# Patient Record
Sex: Male | Born: 1985 | Race: Black or African American | Hispanic: No | Marital: Married | Smoking: Current some day smoker
Health system: Southern US, Community
[De-identification: ages and names within clinical notes are randomized; demographics above are authoritative.]

## PROBLEM LIST (undated history)

## (undated) HISTORY — PX: NO PAST SURGERIES: SHX2092

---
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
## 2009-05-29 ENCOUNTER — Emergency Department: Payer: Self-pay | Admitting: Emergency Medicine

## 2010-05-09 ENCOUNTER — Emergency Department: Payer: Self-pay | Admitting: Emergency Medicine

## 2015-05-23 ENCOUNTER — Ambulatory Visit
Admission: EM | Admit: 2015-05-23 | Discharge: 2015-05-23 | Disposition: A | Discharge status: Home / Self Care | Payer: BLUE CROSS/BLUE SHIELD | Attending: Family Medicine | Admitting: Family Medicine

## 2015-05-23 ENCOUNTER — Encounter: Payer: Self-pay | Admitting: *Deleted

## 2015-05-23 DIAGNOSIS — K529 Noninfective gastroenteritis and colitis, unspecified: Secondary | ICD-10-CM | POA: Insufficient documentation

## 2015-05-23 DIAGNOSIS — Z791 Long term (current) use of non-steroidal anti-inflammatories (NSAID): Secondary | ICD-10-CM | POA: Diagnosis not present

## 2015-05-23 DIAGNOSIS — R300 Dysuria: Secondary | ICD-10-CM

## 2015-05-23 DIAGNOSIS — R1031 Right lower quadrant pain: Secondary | ICD-10-CM | POA: Diagnosis present

## 2015-05-23 DIAGNOSIS — R109 Unspecified abdominal pain: Secondary | ICD-10-CM | POA: Diagnosis not present

## 2015-05-23 DIAGNOSIS — F17200 Nicotine dependence, unspecified, uncomplicated: Secondary | ICD-10-CM | POA: Insufficient documentation

## 2015-05-23 DIAGNOSIS — N410 Acute prostatitis: Secondary | ICD-10-CM | POA: Insufficient documentation

## 2015-05-23 LAB — CBC WITH DIFFERENTIAL/PLATELET
BASOS ABS: 0 10*3/uL (ref 0–0.1)
BASOS ABS: 0 10*3/uL (ref 0–0.1)
BASOS PCT: 0 %
Basophils Relative: 0 %
EOS ABS: 0 10*3/uL (ref 0–0.7)
EOS PCT: 0 %
EOS PCT: 0 %
Eosinophils Absolute: 0 10*3/uL (ref 0–0.7)
HEMATOCRIT: 43.2 % (ref 40.0–52.0)
HEMATOCRIT: 44.3 % (ref 40.0–52.0)
HEMOGLOBIN: 15.1 g/dL (ref 13.0–18.0)
Hemoglobin: 14.7 g/dL (ref 13.0–18.0)
LYMPHS ABS: 0.9 10*3/uL — AB (ref 1.0–3.6)
LYMPHS ABS: 1.2 10*3/uL (ref 1.0–3.6)
LYMPHS PCT: 14 %
LYMPHS PCT: 18 %
MCH: 28.3 pg (ref 26.0–34.0)
MCH: 28.5 pg (ref 26.0–34.0)
MCHC: 34 g/dL (ref 32.0–36.0)
MCHC: 34.1 g/dL (ref 32.0–36.0)
MCV: 83.2 fL (ref 80.0–100.0)
MCV: 83.5 fL (ref 80.0–100.0)
MONO ABS: 0.7 10*3/uL (ref 0.2–1.0)
MONO ABS: 0.6 10*3/uL (ref 0.2–1.0)
MONOS PCT: 11 %
Monocytes Relative: 9 %
NEUTROS ABS: 4.6 10*3/uL (ref 1.4–6.5)
Neutro Abs: 4.8 10*3/uL (ref 1.4–6.5)
Neutrophils Relative %: 75 %
Neutrophils Relative %: 73 %
Platelets: 190 10*3/uL (ref 150–440)
Platelets: 197 10*3/uL (ref 150–440)
RBC: 5.19 MIL/uL (ref 4.40–5.90)
RBC: 5.31 MIL/uL (ref 4.40–5.90)
RDW: 13.3 % (ref 11.5–14.5)
RDW: 13.4 % (ref 11.5–14.5)
WBC: 6.1 10*3/uL (ref 3.8–10.6)
WBC: 6.6 10*3/uL (ref 3.8–10.6)

## 2015-05-23 LAB — COMPREHENSIVE METABOLIC PANEL
ALBUMIN: 4.5 g/dL (ref 3.5–5.0)
ALK PHOS: 78 U/L (ref 38–126)
ALT: 34 U/L (ref 17–63)
ALT: 35 U/L (ref 17–63)
ANION GAP: 6 (ref 5–15)
AST: 25 U/L (ref 15–41)
AST: 28 U/L (ref 15–41)
Albumin: 4.2 g/dL (ref 3.5–5.0)
Alkaline Phosphatase: 81 U/L (ref 38–126)
Anion gap: 9 (ref 5–15)
BILIRUBIN TOTAL: 0.8 mg/dL (ref 0.3–1.2)
BILIRUBIN TOTAL: 0.9 mg/dL (ref 0.3–1.2)
BUN: 10 mg/dL (ref 6–20)
BUN: 10 mg/dL (ref 6–20)
CALCIUM: 9.4 mg/dL (ref 8.9–10.3)
CO2: 30 mmol/L (ref 22–32)
CO2: 30 mmol/L (ref 22–32)
CREATININE: 1.08 mg/dL (ref 0.61–1.24)
Calcium: 9.5 mg/dL (ref 8.9–10.3)
Chloride: 96 mmol/L — ABNORMAL LOW (ref 101–111)
Chloride: 99 mmol/L — ABNORMAL LOW (ref 101–111)
Creatinine, Ser: 1.08 mg/dL (ref 0.61–1.24)
GFR calc non Af Amer: >60 mL/min (ref >60)
Glucose, Bld: 96 mg/dL (ref 65–99)
Glucose, Bld: 94 mg/dL (ref 65–99)
POTASSIUM: 3.7 mmol/L (ref 3.5–5.1)
POTASSIUM: 3.5 mmol/L (ref 3.5–5.1)
SODIUM: 135 mmol/L (ref 135–145)
Sodium: 135 mmol/L (ref 135–145)
TOTAL PROTEIN: 7.8 g/dL (ref 6.5–8.1)
Total Protein: 8.2 g/dL — ABNORMAL HIGH (ref 6.5–8.1)

## 2015-05-23 LAB — URINALYSIS COMPLETE WITH MICROSCOPIC (ARMC ONLY)
Bacteria, UA: NONE SEEN — AB
GLUCOSE, UA: NEGATIVE mg/dL
KETONES UR: NEGATIVE mg/dL
LEUKOCYTES UA: NEGATIVE
NITRITE: NEGATIVE
PROTEIN: NEGATIVE mg/dL
SPECIFIC GRAVITY, URINE: 1.02 (ref 1.005–1.030)
pH: 7 (ref 5.0–8.0)

## 2015-05-23 LAB — CHLAMYDIA/NGC RT PCR (ARMC ONLY)
Chlamydia Tr: NOT DETECTED
N GONORRHOEAE: NOT DETECTED

## 2015-05-23 LAB — LIPASE, BLOOD: LIPASE: 30 U/L (ref 22–51)

## 2015-05-23 MED ORDER — KETOROLAC TROMETHAMINE 60 MG/2ML IM SOLN
60.0000 mg | Freq: Once | INTRAMUSCULAR | Status: AC
  Administered 2015-05-23: 60 mg via INTRAMUSCULAR

## 2015-05-23 MED ORDER — ACETAMINOPHEN 500 MG PO TABS: 1000.0000 mg | ORAL_TABLET | Freq: Once | ORAL | Status: DC

## 2015-05-23 NOTE — ED Notes (Signed)
Pt sent for [facility] due to abdominal pain and need for CT Scan. Pain in mid abdomen. Had blood/urine at urgent care. Febrile, no WBC.

## 2015-05-23 NOTE — ED Notes (Signed)
Spoke to MD Sharma CovertNorman about ordering CT Scan, states to wait until physician evaluation.

## 2015-05-23 NOTE — Discharge Instructions (Signed)
As discussed, go to ER for further evaluation. Go directly there. Do not eat or drink.   Return to Urgent care for new or worsening concerns.   Abdominal Pain, Adult Many things can cause abdominal pain. Usually, abdominal pain is not caused by a disease and will improve without treatment. It can often be observed and treated at home. Your health care provider will do a physical exam and possibly order blood tests and X-rays to help determine the seriousness of your pain. However, in many cases, more time must pass before a clear cause of the pain can be found. Before that point, your health care provider may not know if you need more testing or further treatment. HOME CARE INSTRUCTIONS Monitor your abdominal pain for any changes. The following actions may help to alleviate any discomfort you are experiencing:  Only take over-the-counter or prescription medicines as directed by your health care provider.  Do not take laxatives unless directed to do so by your health care provider.  Try a clear liquid diet (broth, tea, or water) as directed by your health care provider. Slowly move to a bland diet as tolerated. SEEK MEDICAL CARE IF:  You have unexplained abdominal pain.  You have abdominal pain associated with nausea or diarrhea.  You have pain when you urinate or have a bowel movement.  You experience abdominal pain that wakes you in the night.  You have abdominal pain that is worsened or improved by eating food.  You have abdominal pain that is worsened with eating fatty foods.  You have a fever. SEEK IMMEDIATE MEDICAL CARE IF:  Your pain does not go away within 2 hours.  You keep throwing up (vomiting).  Your pain is felt only in portions of the abdomen, such as the right side or the left lower portion of the abdomen.  You pass bloody or black tarry stools. MAKE SURE YOU:  Understand these instructions.  Will watch your condition.  Will get help right away if you are not  doing well or get worse.   This information is not intended to replace advice given to you by your health care provider. Make sure you discuss any questions you have with your health care provider.   Document Released: 04/30/2005 Document Revised: 04/11/2015 Document Reviewed: 03/30/2013 Elsevier Interactive Patient Education [employer]2016 Elsevier Inc.

## 2015-05-23 NOTE — ED Provider Notes (Addendum)
[facility] Emergency Department Provider Note  ____________________________________________  Time seen: Approximately 1800 PM  I have reviewed the triage vital signs and the nursing notes.   HISTORY  Chief Complaint Urinary Frequency and Back Pain  HPI Draysen Vigen is a 29 y.o. male presents for the complaint of lower back pain, abdominal pain and urinary discomfort. Patient reports fever onset today, and reports pain also increased today. Patient reports that last night he had several episodes of diarrhea. Otherwise denies diarrhea. Denies food changes. States today with nausea and decreased appetite, but reports has still continued to eat. States last ate at approximately 1 PM. Denies vomiting. States took Aleve prior to arrival for pain and fever.  Patient describes urinary discomfort as a burning with urination that is only present while actively urinating. Patient states that this is not present every time he voids. States that burning is intermittent, and noticed it more around the same time of having bowel movements. Patient denies penile discharge, penile pain or swelling or testicular pain or swelling. Denies urinary stream changes. States he is currently sexually active with times one partner which is his wife. Denies recent sexual partner changes.   States current pain is 9 out of 10 aching and stabbing. States majority of pain is in his back and lower abdomen. States he feels "achy all over".Denies blood in urine, bloody or abnormal stools, or blood in toilet. Denies chest pain or shortness of breath.    History reviewed. No pertinent past medical history.  There are no active problems to display for this patient.   History reviewed. No pertinent past surgical history.  Current Outpatient Rx  Name  Route  Sig  Dispense  Refill  . naproxen sodium (ANAPROX) 220 MG tablet   Oral   Take 440 mg by mouth 2 (two) times daily with a meal.            Allergies Review of patient's allergies indicates no known allergies.  No family history on file.  Social History Social History  Substance Use Topics  . Smoking status: Current Every Day Smoker  . Smokeless tobacco: None  . Alcohol Use: Yes    Review of Systems Constitutional: positive fever Eyes: No visual changes. ENT: No sore throat. Denies cough, congestion, runny nose.  Cardiovascular: Denies chest pain. Respiratory: Denies shortness of breath.Denies cough.  Gastrointestinal: positive abdominal pain.  No nausea, no vomiting.  No diarrhea.  No constipation. Genitourinary: Negative for dysuria. Musculoskeletal: Negative for back pain. Denies neck pain.  Skin: Negative for rash. Neurological: Negative for headaches, focal weakness or numbness.  10-point ROS otherwise negative.  ____________________________________________   PHYSICAL EXAM:  VITAL SIGNS: ED Triage Vitals  Enc Vitals Group     BP 05/23/15 1714 125/72 mmHg     Pulse Rate 05/23/15 1714 97     Resp 05/23/15 1714 16     Temp 05/23/15 1714 100.6 F (38.1 C)     Temp Source 05/23/15 1714 Oral     SpO2 05/23/15 1714 100 %     Weight 05/23/15 1714 200 lb (90.719 kg)     Height 05/23/15 1714  (1.702 m)     Head Cir --      Peak Flow --      Pain Score 05/23/15 1710 10     Pain Loc --      Pain Edu? --      Excl. in GC? --     Constitutional: Alert  and oriented. Well appearing and in no acute distress. Eyes: Conjunctivae are normal. PERRL. EOMI. Head: Atraumatic. No swelling or erythema.   Ears: no erythema, normal TMs bilaterally.   Nose: No congestion/rhinnorhea.  Mouth/Throat: Mucous membranes are moist.  Oropharynx non-erythematous. Neck: No stridor.  No cervical spine tenderness to palpation. Hematological/Lymphatic/Immunilogical: No cervical lymphadenopathy. Cardiovascular: Normal rate, regular rhythm. Grossly normal heart sounds.  Good peripheral circulation. Respiratory: Normal  respiratory effort.  No retractions. Lungs CTAB. Gastrointestinal: Soft. Mild generalized TTP, mod TTP suprapubic and mod TTP RLQ.   No distention. Normal Bowel sounds.  No abdominal bruits. Mild left and right CVA tenderness.  Negative obturator sign. Negative Rovsing sign.  Prostate exam: completed with RN Pattricia BossAnnie at bedside No external hemorrhoids visualized. No rash or lesions. Prostate mild tenderness and firmness. No right sided tenderness. Stool normal color appearance.  Patient refused male genital exam.  Musculoskeletal: No lower or upper extremity tenderness nor edema.  No joint effusions. Bilateral pedal pulses equal and easily palpated. No midline cervical, thoracic or lumbar TTP.  Neurologic:  Normal speech and language. No gross focal neurologic deficits are appreciated. No gait instability.No meningismus.  Skin:  Skin is warm, dry and intact. No rash noted. Psychiatric: Mood and affect are normal. Speech and behavior are normal.  ____________________________________________   LABS (all labs ordered are listed, but only abnormal results are displayed)  Labs Reviewed  URINALYSIS COMPLETEWITH MICROSCOPIC ([facility] ONLY) - Abnormal; Notable for the following:    Color, Urine AMBER (*)    Bilirubin Urine 1+ (*)    Hgb urine dipstick TRACE (*)    Bacteria, UA NONE SEEN (*)    Squamous Epithelial / LPF 0-5 (*)    All other components within normal limits  CBC WITH DIFFERENTIAL/PLATELET - Abnormal; Notable for the following:    Lymphs Abs 0.9 (*)    All other components within normal limits  COMPREHENSIVE METABOLIC PANEL - Abnormal; Notable for the following:    Chloride 96 (*)    All other components within normal limits  URINE CULTURE  CHLAMYDIA/NGC RT PCR ([facility] ONLY)  LIPASE, BLOOD     INITIAL IMPRESSION / ASSESSMENT AND PLAN / ED COURSE  Pertinent labs & imaging results that were available during my care of the patient were reviewed by me and considered in my  medical decision making (see chart for details).   1800: Patient presents for multiple complaints including fever, back pain, abdominal pain and urinary burning. Patient appears uncomfortable. Presents febrile. On initial exam patient with positive left CVA tenderness, moderate tenderness to palpation suprapubic and right lower quadrant abdomen. Awaiting urinalysis. Will evaluate labs. Will also evaluate for gonorrhea and chlamydia in urine, however do not suspect is that this will be positive per patient reported history. Concern for urinary tract infection or polynephritis versus prostatitis versus appendicitis. Patient at this time refuses prostate exam. Refuses genital exam. Patient states that he will consider to have prostate exam performed. Patient was given 60 mg IM Toradol by RN.   1900: Patient reports back pain has improved. Patient states that the Toradol improved back pain. However patient reports continued abdominal pain. Patient reexamined. Patient with mild generalized abdominal tenderness, and reports 8 out of 10 suprapubic tenderness to palpation and 10 out of 10 right lower quadrant abdominal tenderness to palpation. Patient also did consent to prostate exam. Prostate exam completed with Pattricia BossAnnie RN at bedside. Patient with firm and mild to moderate tenderness prostate. Temperature now 99.6 orally.  Labs and urine reviewed. WBC count normal. Urinalysis negative for bacteria, amber color, 1+ bilirubin, trace hemoglobin. Concern primarily for prostatitis versus appendicitis.  Discussed in detail with the patient regarding these and regarding further diagnosis and evaluation. Recommended patient to be further evaluation CT of abdomen. There is no CT abilities at this facility at this time. Also as patient continues with significant report of pain well refer patient to emergency room of his choice for further evaluation. Patient reports that he plans to go to [facility] regional ER. Charge nurse  Raquel called and report given. Patient alert and oriented with decisional capacity and plans to go by private vehicle. Discussed with patient to remain nothing by mouth. Patient agrees to this plan. Patient plans to go directly to the ER for further evaluation. Discussed follow up with Primary care physician this week. Discussed follow up and return parameters including no resolution or any worsening concerns. Patient verbalized understanding and agreed to plan.    Discussed in detail with Dr Judd Gaudier who also reviewed patient and agrees with plan.   ____________________________________________   FINAL CLINICAL IMPRESSION(S) / ED DIAGNOSES  Final diagnoses:  Abdominal pain, unspecified abdominal location  Dysuria       Renford Dills, NP 05/23/15 2113  Renford Dills, NP 05/23/15 2120

## 2015-05-23 NOTE — ED Notes (Signed)
Pt states "I have frequent urination, painful, started Monday. Today I have back and fever as well."

## 2015-05-24 ENCOUNTER — Emergency Department: Payer: BLUE CROSS/BLUE SHIELD

## 2015-05-24 ENCOUNTER — Emergency Department
Admission: EM | Admit: 2015-05-24 | Discharge: 2015-05-24 | Disposition: A | Discharge status: Home / Self Care | Payer: BLUE CROSS/BLUE SHIELD | Attending: Emergency Medicine | Admitting: Emergency Medicine

## 2015-05-24 DIAGNOSIS — N410 Acute prostatitis: Secondary | ICD-10-CM

## 2015-05-24 DIAGNOSIS — K529 Noninfective gastroenteritis and colitis, unspecified: Secondary | ICD-10-CM

## 2015-05-24 MED ORDER — CIPROFLOXACIN HCL 500 MG PO TABS: 500.0000 mg | ORAL_TABLET | Freq: Two times a day (BID) | ORAL | Status: AC

## 2015-05-24 MED ORDER — IOHEXOL 300 MG/ML  SOLN
100.0000 mL | Freq: Once | INTRAMUSCULAR | Status: AC | PRN
  Administered 2015-05-24: 100 mL via INTRAVENOUS

## 2015-05-24 MED ORDER — IOHEXOL 240 MG/ML SOLN
25.0000 mL | Freq: Once | INTRAMUSCULAR | Status: AC | PRN
  Administered 2015-05-24: 25 mL via ORAL

## 2015-05-24 NOTE — Discharge Instructions (Signed)
Prostatitis The prostate gland is about the size and shape of a walnut. It is located just below your bladder. It produces one of the components of semen, which is made up of sperm and the fluids that help nourish and transport it out from the testicles. Prostatitis is inflammation of the prostate gland.  There are four types of prostatitis:  Acute bacterial prostatitis. This is the least common type of prostatitis. It starts quickly and usually is associated with a bladder infection, high fever, and shaking chills. It can occur at any age.  Chronic bacterial prostatitis. This is a persistent bacterial infection in the prostate. It usually develops from repeated acute bacterial prostatitis or acute bacterial prostatitis that was not properly treated. It can occur in men of any age but is most common in middle-aged men whose prostate has begun to enlarge. The symptoms are not as severe as those in acute bacterial prostatitis. Discomfort in the part of your body that is in front of your rectum and below your scrotum (perineum), lower abdomen, or in the head of your penis (glans) may represent your primary discomfort.  Chronic prostatitis (nonbacterial). This is the most common type of prostatitis. It is inflammation of the prostate gland that is not caused by a bacterial infection. The cause is unknown and may be associated with a viral infection or autoimmune disorder.  Prostatodynia (pelvic floor disorder). This is associated with increased muscular tone in the pelvis surrounding the prostate. CAUSES The causes of bacterial prostatitis are bacterial infection. The causes of the other types of prostatitis are unknown.  SYMPTOMS  Symptoms can vary depending upon the type of prostatitis that exists. There can also be overlap in symptoms. Possible symptoms for each type of prostatitis are listed below. Acute Bacterial Prostatitis  Painful urination.  Fever or chills.  Muscle or joint pains.  Low  back pain.  Low abdominal pain.  Inability to empty bladder completely. Chronic Bacterial Prostatitis, Chronic Nonbacterial Prostatitis, and Prostatodynia  Sudden urge to urinate.  Frequent urination.  Difficulty starting urine stream.  Weak urine stream.  Discharge from the urethra.  Dribbling after urination.  Rectal pain.  Pain in the testicles, penis, or tip of the penis.  Pain in the perineum.  Problems with sexual function.  Painful ejaculation.  Bloody semen. DIAGNOSIS  In order to diagnose prostatitis, your health care provider will ask about your symptoms. One or more urine samples will be taken and tested (urinalysis). If the urinalysis result is negative for bacteria, your health care provider may use a finger to feel your prostate (digital rectal exam). This exam helps your health care provider determine if your prostate is swollen and tender. It will also produce a specimen of semen that can be analyzed. TREATMENT  Treatment for prostatitis depends on the cause. If a bacterial infection is the cause, it can be treated with antibiotic medicine. In cases of chronic bacterial prostatitis, the use of antibiotics for up to 1 month or 6 weeks may be necessary. Your health care provider may instruct you to take sitz baths to help relieve pain. A sitz bath is a bath of hot water in which your hips and buttocks are under water. This relaxes the pelvic floor muscles and often helps to relieve the pressure on your prostate. HOME CARE INSTRUCTIONS   Take all medicines as directed by your health care provider.  Take sitz baths as directed by your health care provider. SEEK MEDICAL CARE IF:   Your symptoms  get worse, not better.  You have a fever. SEEK IMMEDIATE MEDICAL CARE IF:   You have chills.  You feel nauseous or vomit.  You feel lightheaded or faint.  You are unable to urinate.  You have blood or blood clots in your urine. MAKE SURE YOU:  Understand  these instructions.  Will watch your condition.  Will get help right away if you are not doing well or get worse.   This information is not intended to replace advice given to you by your health care provider. Make sure you discuss any questions you have with your health care provider.   Document Released: 07/18/2000 Document Revised: 08/11/2014 Document Reviewed: 02/07/2013 Elsevier Interactive Patient Education 2016 Else[employer] Inc.  Colitis Colitis is inflammation of the colon. Colitis may last a short time (acute) or it may last a long time (chronic). CAUSES This condition may be caused by:  Viruses.  Bacteria.  Reactions to medicine.  Certain autoimmune diseases, such as Crohn disease or ulcerative colitis. SYMPTOMS Symptoms of this condition include:  Diarrhea.  Passing bloody or tarry stool.  Pain.  Fever.  Vomiting.  Tiredness (fatigue).  Weight loss.  Bloating.  Sudden increase in abdominal pain.  Having fewer bowel movements than usual. DIAGNOSIS This condition is diagnosed with a stool test or a blood test. You may also have other tests, including X-rays, a CT scan, or a colonoscopy. TREATMENT Treatment may include:  Resting the bowel. This involves not eating or drinking for a period of time.  Fluids that are given through an IV tube.  Medicine for pain and diarrhea.  Antibiotic medicines.  Cortisone medicines.  Surgery. HOME CARE INSTRUCTIONS Eating and Drinking  Follow instructions from your health care provider about eating or drinking restrictions.  Drink enough fluid to keep your urine clear or pale yellow.  Work with a dietitian to determine which foods cause your condition to flare up.  Avoid foods that cause flare-ups.  Eat a well-balanced diet. Medicines  Take over-the-counter and prescription medicines only as told by your health care provider.  If you were prescribed an antibiotic medicine, take it as told by your  health care provider. Do not stop taking the antibiotic even if you start to feel better. General Instructions  Keep all follow-up visits as told by your health care provider. This is important. SEEK MEDICAL CARE IF:  Your symptoms do not go away.  You develop new symptoms. SEEK IMMEDIATE MEDICAL CARE IF:  You have a fever that does not go away with treatment.  You develop chills.  You have extreme weakness, fainting, or dehydration.  You have repeated vomiting.  You develop severe pain in your abdomen.  You pass bloody or tarry stool.   This information is not intended to replace advice given to you by your health care provider. Make sure you discuss any questions you have with your health care provider.   Document Released: 08/28/2004 Document Revised: 04/11/2015 Document Reviewed: 11/13/2014 Elsevier Interactive Patient Education [employer]

## 2015-05-24 NOTE — ED Notes (Signed)
[facility] lower back pain and lower abdominal pain.  Pt. States MUC gave him a shot of Toradol and it imporved back pain.  Pt. States he still has lower rt. Abdominal pain at this time.  Pt. States pain upon palpation.

## 2015-05-24 NOTE — ED Provider Notes (Signed)
[facility] Emergency Department Provider Note  ____________________________________________  Time seen: 1:40 AM  I have reviewed the triage vital signs and the nursing notes.   HISTORY  Chief Complaint Abdominal Pain      HPI James Mcconnell is a 29 y.o. male presents referred from urgent care facility for right lower quadrant abdominal pain as well as dysuria times one day. patient describes current pain is sharp and moderate. Patient denies any aggravating or alleviating factors. Laboratory data performed by urgent care facility unremarkable. Of note patient states that he's also had 1 day history of diarrhea.     Past medical history None There are no active problems to display for this patient.   Past surgical history None  Current Outpatient Rx  Name  Route  Sig  Dispense  Refill  . acetaminophen (TYLENOL) 325 MG tablet   Oral   Take 650 mg by mouth every 6 (six) hours as needed.         . naproxen sodium (ANAPROX) 220 MG tablet   Oral   Take 440 mg by mouth 2 (two) times daily with a meal.           Allergies Review of patient's allergies indicates no known allergies.  No family history on file.  Social History Social History  Substance Use Topics  . Smoking status: Current Every Day Smoker  . Smokeless tobacco: None  . Alcohol Use: Yes    Review of Systems  Constitutional: Negative for fever. Eyes: Negative for visual changes. ENT: Negative for sore throat. Cardiovascular: Negative for chest pain. Respiratory: Negative for shortness of breath. Gastrointestinal: Positive for abdominal pain and diarrhea. Genitourinary: Negative for dysuria. Musculoskeletal: Negative for back pain. Skin: Negative for rash. Neurological: Negative for headaches, focal weakness or numbness.   10-point ROS otherwise negative.  ____________________________________________   PHYSICAL EXAM:  VITAL SIGNS: ED Triage Vitals  Enc Vitals  Group     BP 05/23/15 2018 131/56 mmHg     Pulse Rate 05/23/15 2018 76     Resp 05/23/15 2018 16     Temp 05/23/15 2018 98.2 F (36.8 C)     Temp Source 05/23/15 2018 Oral     SpO2 05/23/15 2018 97 %     Weight 05/23/15 2018 200 lb (90.719 kg)     Height 05/23/15 2018  (1.702 m)     Head Cir --      Peak Flow --      Pain Score 05/23/15 2017 8     Pain Loc --      Pain Edu? --      Excl. in GC? --      Constitutional: Alert and oriented. Well appearing and in no distress. Eyes: Conjunctivae are normal. PERRL. Normal extraocular movements. ENT   Head: Normocephalic and atraumatic.   Nose: No congestion/rhinnorhea.   Mouth/Throat: Mucous membranes are moist.   Neck: No stridor. Hematological/Lymphatic/Immunilogical: No cervical lymphadenopathy. Cardiovascular: Normal rate, regular rhythm. Normal and symmetric distal pulses are present in all extremities. No murmurs, rubs, or gallops. Respiratory: Normal respiratory effort without tachypnea nor retractions. Breath sounds are clear and equal bilaterally. No wheezes/rales/rhonchi. Gastrointestinal: Right lower quadrant tenderness to palpation. No distention. There is no CVA tenderness. Genitourinary: deferred Musculoskeletal: Nontender with normal range of motion in all extremities. No joint effusions.  No lower extremity tenderness nor edema. Neurologic:  Normal speech and language. No gross focal neurologic deficits are appreciated. Speech is normal.  Skin:  Skin is warm, dry and intact. No rash noted. Psychiatric: Mood and affect are normal. Speech and behavior are normal. Patient exhibits appropriate insight and judgment.  ____________________________________________    LABS (pertinent positives/negatives)  Labs Reviewed  COMPREHENSIVE METABOLIC PANEL - Abnormal; Notable for the following:    Chloride 99 (*)    Total Protein 8.2 (*)    All other components within normal limits  CBC WITH  DIFFERENTIAL/PLATELET  URINALYSIS COMPLETEWITH MICROSCOPIC ([facility] ONLY)      RADIOLOGY  CT Abdomen Pelvis W Contrast (Final result) Result time: 05/24/15 01:53:59   Final result by Rad Results In Interface (05/24/15 01:53:59)   Narrative:   CLINICAL DATA: Lower back and lower abdominal pain, improved with Toradol at urgent care center today. Pain with palpation, evaluate RIGHT lower quadrant pain.  EXAM: CT ABDOMEN AND PELVIS WITH CONTRAST  TECHNIQUE: Multidetector CT imaging of the abdomen and pelvis was performed using the standard protocol following bolus administration of intravenous contrast.  CONTRAST: 100mL OMNIPAQUE IOHEXOL 300 MG/ML SOLN  COMPARISON: None.  FINDINGS: LUNG BASES: Included view of the lung bases are clear. Visualized heart and pericardium are unremarkable.  SOLID ORGANS: The liver demonstrates minimal focal fatty infiltration about the falciform ligament, otherwise unremarkable. Spleen, gallbladder, pancreas and adrenal glands are unremarkable.  GASTROINTESTINAL TRACT: Mildly thickened terminal ileum. The stomach, large bowel are normal in course and caliber without inflammatory changes. Normal appendix.  KIDNEYS/ URINARY TRACT: Kidneys are orthotopic, demonstrating symmetric enhancement. No nephrolithiasis, hydronephrosis or solid renal masses. The unopacified ureters are normal in course and caliber. Urinary bladder is partially distended and unremarkable.  PERITONEUM/RETROPERITONEUM: Aortoiliac vessels are normal in course and caliber. No lymphadenopathy by CT size criteria. Internal reproductive organs are unremarkable. No intraperitoneal free fluid nor free air.  SOFT TISSUE/OSSEOUS STRUCTURES: Non-suspicious. Small fat containing umbilical hernia.  IMPRESSION: Mildly thickened terminal ileum can be seen with inflammatory bowel disease, less likely focal enteritis without bowel obstruction or acute complications. Normal  appendix.  No urolithiasis or obstructive uropathy.   Electronically Signed By: Awilda Metroourtnay Bloomer M.D. On: 05/24/2015 01:53          INITIAL IMPRESSION / ASSESSMENT AND PLAN / ED COURSE  Pertinent labs & imaging results that were available during my care of the patient were reviewed by me and considered in my medical decision making (see chart for details).  CT scan of the abdomen and pelvis revealed a normal appendix. Patient with discomfort with palpation of his prostate as per urgent care record. As such we'll treat the patient for prostatitis patient advised to return to the emergency department immediately for any further fever worsening pain or vomiting was to ensue.  ____________________________________________   FINAL CLINICAL IMPRESSION(S) / ED DIAGNOSES  Final diagnoses:  Acute prostatitis  Enteritis      Darci Currentandolph N Oaklyn Jakubek, MD 05/29/15 [phone]

## 2015-05-24 NOTE — ED Notes (Signed)
Pt. Going home with family. 

## 2015-05-25 LAB — URINE CULTURE

## 2016-10-13 IMAGING — CT CT ABD-PELV W/ CM
1 of 2 series · 15 of 32 positions shown, 19 images · IV contrast (omnipaque)
Comparison: None.

CLINICAL DATA: Lower back and lower abdominal pain, improved with
Toradol at [REDACTED] today. Pain with palpation, evaluate
RIGHT lower quadrant pain.

EXAM:
CT ABDOMEN AND PELVIS WITH CONTRAST
TECHNIQUE: Multidetector CT imaging of the abdomen and pelvis was performed
using the standard protocol following bolus administration of
intravenous contrast.
CONTRAST:  100mL OMNIPAQUE IOHEXOL 300 MG/ML  SOLN

[Series 2: routine abd pel with · axial · 0.73mm/px · z∈[-452,+8]mm · 15 of 102 slices shown, 19 images]
[im 5/102  soft-tissue]
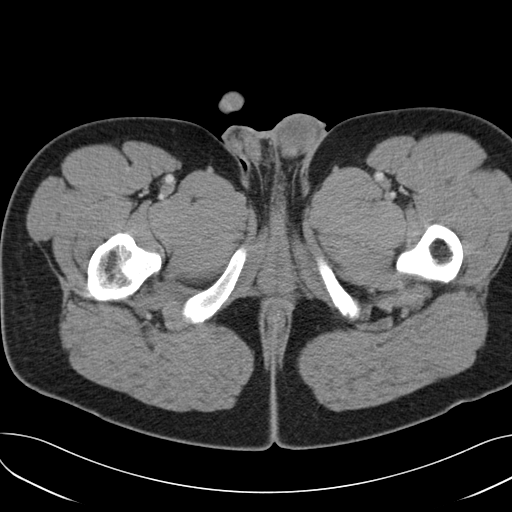
[im 5/102  bone]
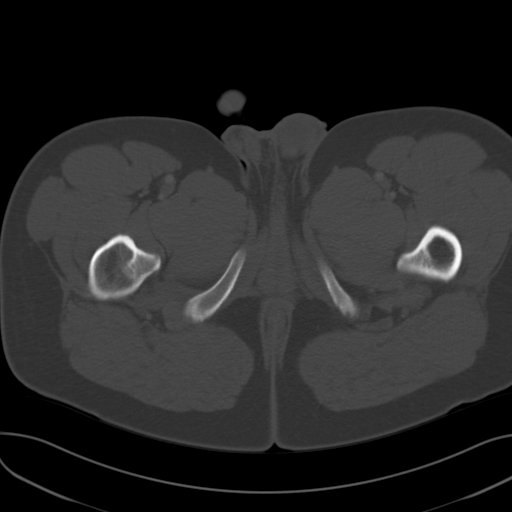
[im 13/102  soft-tissue]
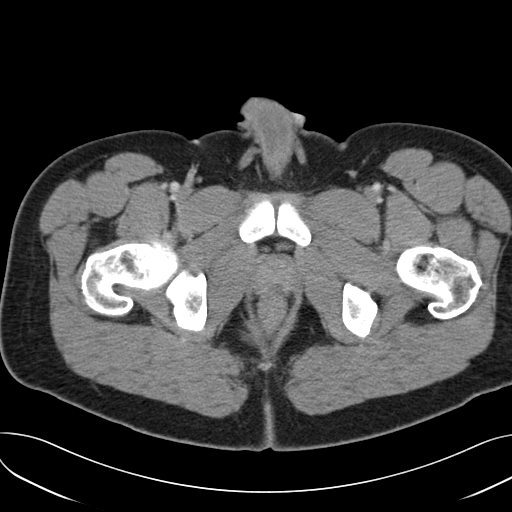
[im 22/102  soft-tissue]
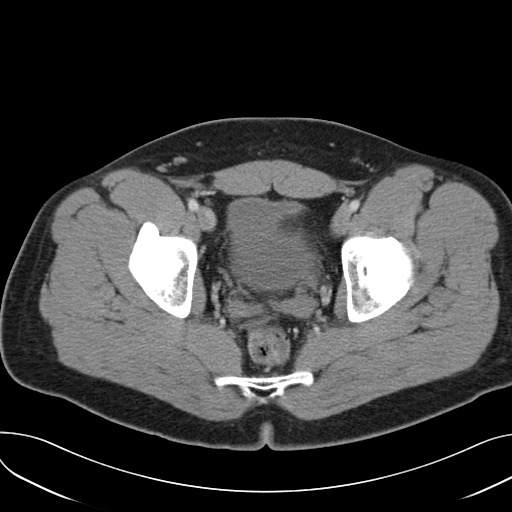
[im 30/102  soft-tissue]
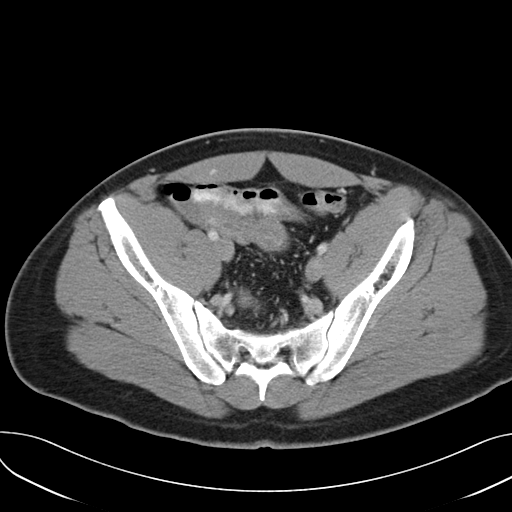
[im 34/102  soft-tissue]
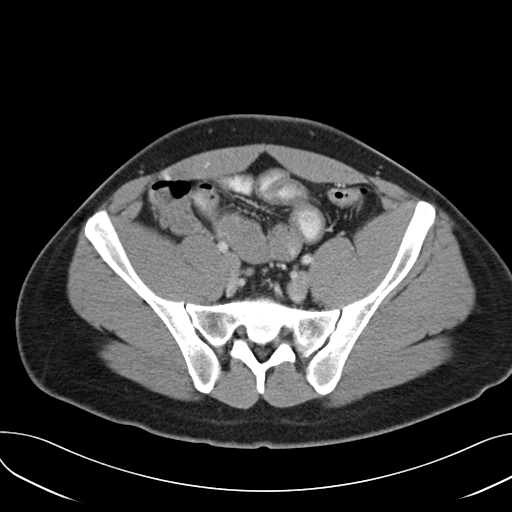
[im 43/102  soft-tissue]
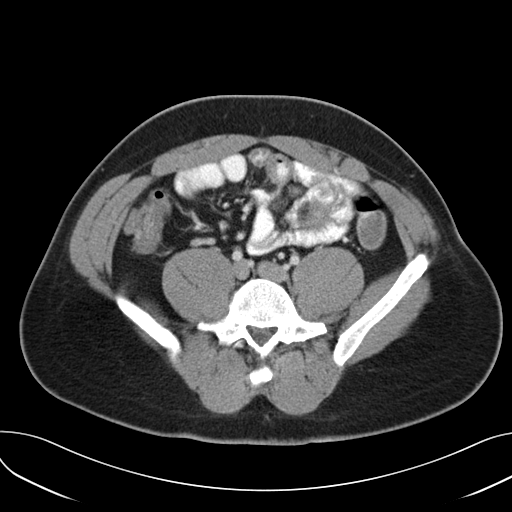
[im 51/102  soft-tissue]
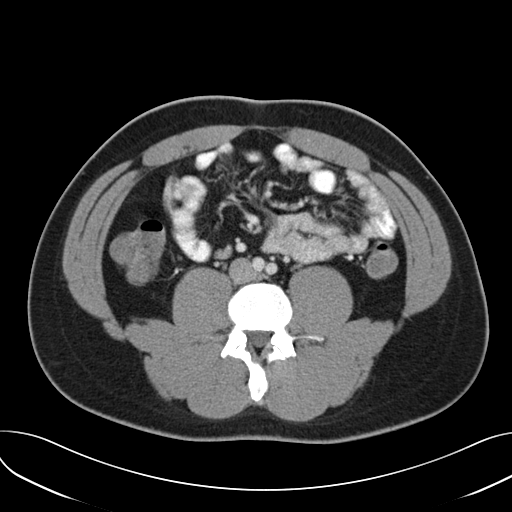
[im 59/102  soft-tissue]
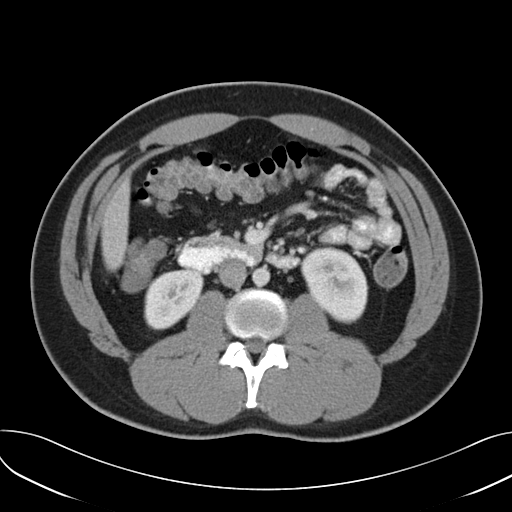
[im 68/102  soft-tissue]
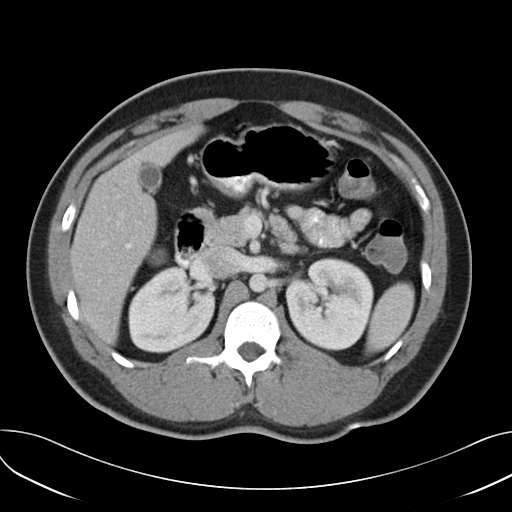
[im 68/102  bone]
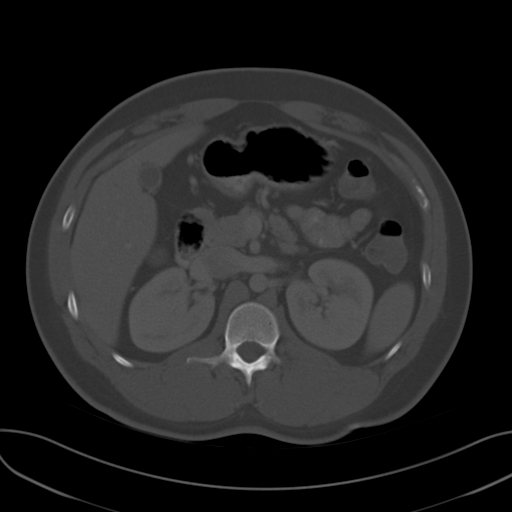
[im 72/102  soft-tissue]
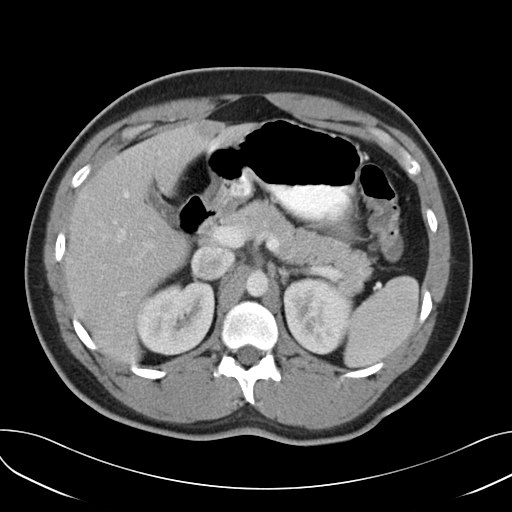
[im 80/102  soft-tissue]
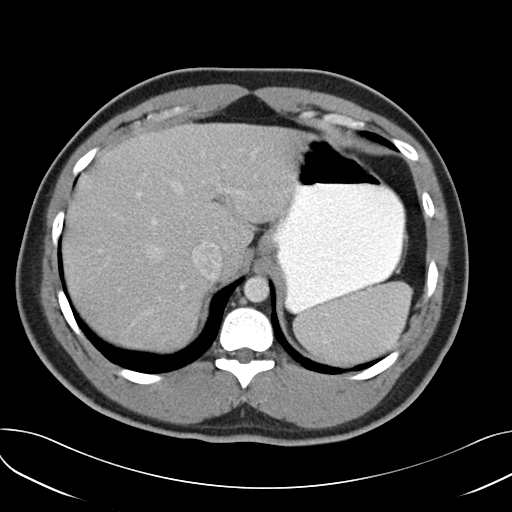
[im 85/102  lung]
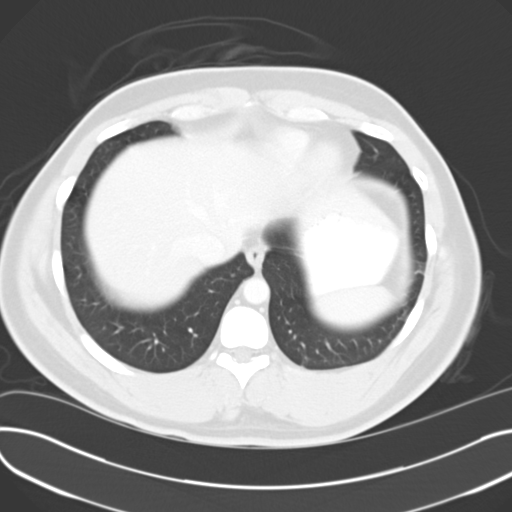
[im 89/102  soft-tissue]
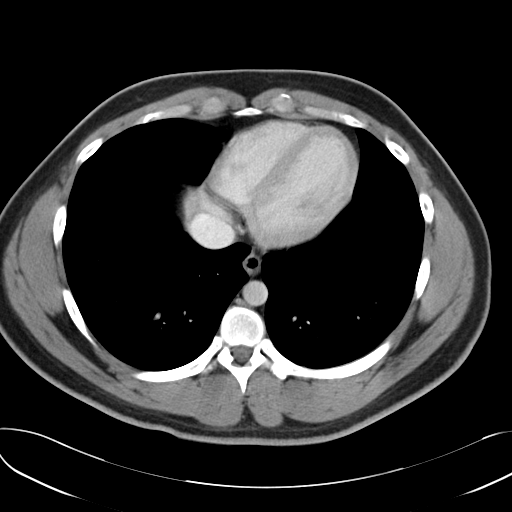
[im 89/102  lung]
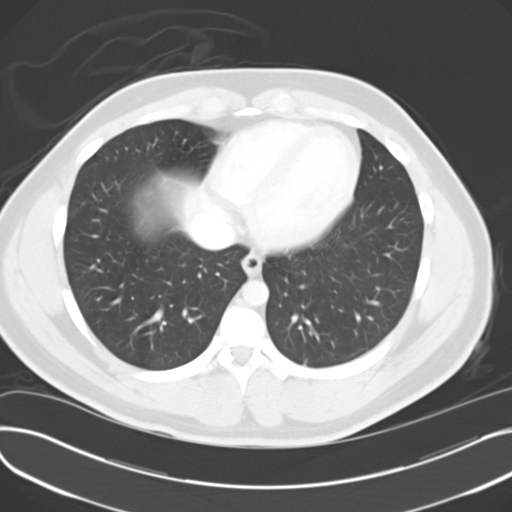
[im 93/102  lung]
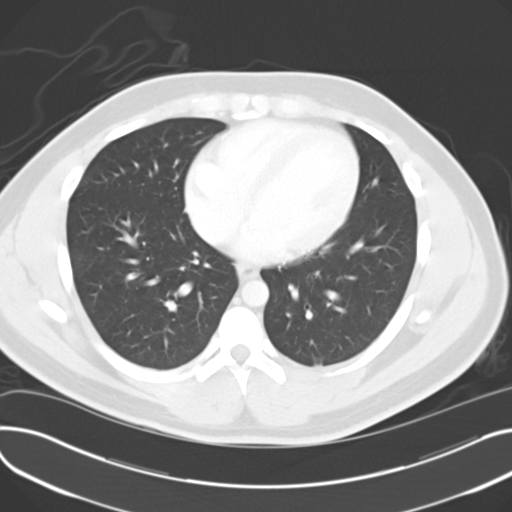
[im 97/102  soft-tissue]
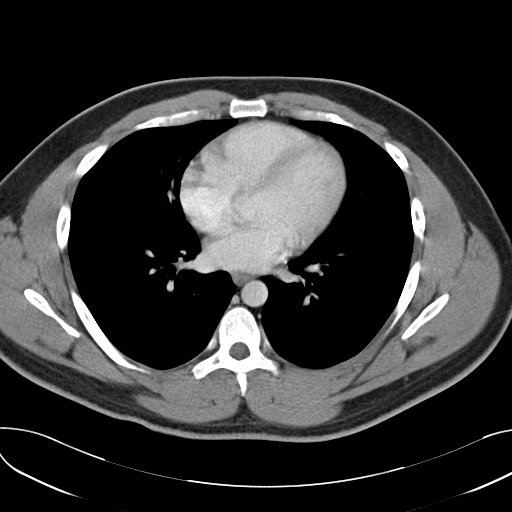
[im 97/102  lung]
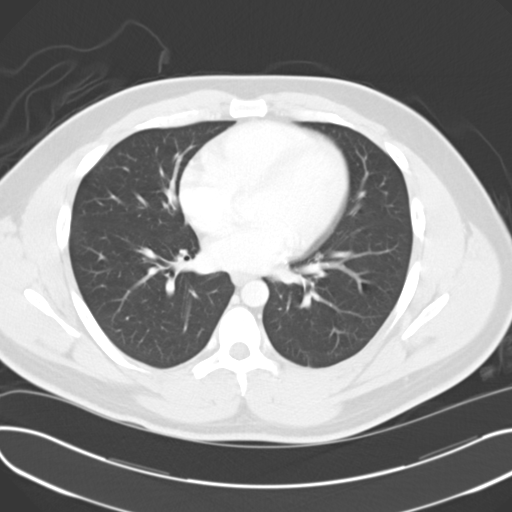

[15 of 32 positions shown; findings below may reference images not displayed]

FINDINGS: LUNG BASES: Included view of the lung bases are clear. Visualized
heart and pericardium are unremarkable.

SOLID ORGANS: The liver demonstrates minimal focal fatty
infiltration about the falciform ligament, otherwise unremarkable.
Spleen, gallbladder, pancreas and adrenal glands are unremarkable.

GASTROINTESTINAL TRACT: Mildly thickened terminal ileum. The
stomach, large bowel are normal in course and caliber without
inflammatory changes. Normal appendix.

KIDNEYS/ URINARY TRACT: Kidneys are orthotopic, demonstrating
symmetric enhancement. No nephrolithiasis, hydronephrosis or solid
renal masses. The unopacified ureters are normal in course and
caliber. Urinary bladder is partially distended and unremarkable.

PERITONEUM/RETROPERITONEUM: Aortoiliac vessels are normal in course
and caliber. No lymphadenopathy by CT size criteria. Internal
reproductive organs are unremarkable. No intraperitoneal free fluid
nor free air.

SOFT TISSUE/OSSEOUS STRUCTURES: Non-suspicious. Small fat containing
umbilical hernia.
IMPRESSION: Mildly thickened terminal ileum can be seen with inflammatory bowel
disease, less likely focal enteritis without bowel obstruction or
acute complications. Normal appendix.

No urolithiasis or obstructive uropathy.

## 2017-04-18 ENCOUNTER — Ambulatory Visit (INDEPENDENT_AMBULATORY_CARE_PROVIDER_SITE_OTHER)
Admission: EM | Admit: 2017-04-18 | Discharge: 2017-04-18 | Disposition: A | Discharge status: Home / Self Care | Payer: BLUE CROSS/BLUE SHIELD | Source: Home / Self Care | Attending: Family Medicine | Admitting: Family Medicine

## 2017-04-18 ENCOUNTER — Inpatient Hospital Stay
Admission: EM | Admit: 2017-04-18 | Discharge: 2017-04-20 | DRG: 287 | Disposition: A | Discharge status: Home / Self Care | Payer: BLUE CROSS/BLUE SHIELD | Attending: Internal Medicine | Admitting: Internal Medicine

## 2017-04-18 ENCOUNTER — Emergency Department: Payer: BLUE CROSS/BLUE SHIELD

## 2017-04-18 DIAGNOSIS — R079 Chest pain, unspecified: Secondary | ICD-10-CM

## 2017-04-18 DIAGNOSIS — I249 Acute ischemic heart disease, unspecified: Secondary | ICD-10-CM

## 2017-04-18 DIAGNOSIS — I214 Non-ST elevation (NSTEMI) myocardial infarction: Secondary | ICD-10-CM

## 2017-04-18 DIAGNOSIS — I10 Essential (primary) hypertension: Secondary | ICD-10-CM | POA: Diagnosis present

## 2017-04-18 DIAGNOSIS — R0789 Other chest pain: Secondary | ICD-10-CM | POA: Diagnosis present

## 2017-04-18 DIAGNOSIS — I959 Hypotension, unspecified: Secondary | ICD-10-CM | POA: Diagnosis present

## 2017-04-18 DIAGNOSIS — Z886 Allergy status to analgesic agent status: Secondary | ICD-10-CM

## 2017-04-18 DIAGNOSIS — K219 Gastro-esophageal reflux disease without esophagitis: Secondary | ICD-10-CM | POA: Diagnosis present

## 2017-04-18 DIAGNOSIS — R001 Bradycardia, unspecified: Secondary | ICD-10-CM | POA: Diagnosis present

## 2017-04-18 DIAGNOSIS — Z91018 Allergy to other foods: Secondary | ICD-10-CM | POA: Diagnosis not present

## 2017-04-18 DIAGNOSIS — Z716 Tobacco abuse counseling: Secondary | ICD-10-CM | POA: Diagnosis not present

## 2017-04-18 DIAGNOSIS — F17210 Nicotine dependence, cigarettes, uncomplicated: Secondary | ICD-10-CM | POA: Diagnosis present

## 2017-04-18 DIAGNOSIS — R9431 Abnormal electrocardiogram [ECG] [EKG]: Secondary | ICD-10-CM

## 2017-04-18 HISTORY — DX: Chest pain, unspecified: R07.9

## 2017-04-18 LAB — CBC WITH DIFFERENTIAL/PLATELET
Basophils Absolute: 0 10*3/uL (ref 0–0.1)
Basophils Relative: 0 %
Eosinophils Absolute: 0.1 10*3/uL (ref 0–0.7)
Eosinophils Relative: 2 %
HEMATOCRIT: 42.9 % (ref 40.0–52.0)
HEMOGLOBIN: 14.8 g/dL (ref 13.0–18.0)
LYMPHS ABS: 1.4 10*3/uL (ref 1.0–3.6)
LYMPHS PCT: 22 %
MCH: 28.8 pg (ref 26.0–34.0)
MCHC: 34.5 g/dL (ref 32.0–36.0)
MCV: 83.5 fL (ref 80.0–100.0)
Monocytes Absolute: 0.4 10*3/uL (ref 0.2–1.0)
Monocytes Relative: 6 %
NEUTROS ABS: 4.6 10*3/uL (ref 1.4–6.5)
NEUTROS PCT: 70 %
Platelets: 245 10*3/uL (ref 150–440)
RBC: 5.14 MIL/uL (ref 4.40–5.90)
RDW: 13 % (ref 11.5–14.5)
WBC: 6.6 10*3/uL (ref 3.8–10.6)

## 2017-04-18 LAB — COMPREHENSIVE METABOLIC PANEL
ALBUMIN: 4.1 g/dL (ref 3.5–5.0)
ALK PHOS: 77 U/L (ref 38–126)
ALT: 61 U/L (ref 17–63)
ANION GAP: 6 (ref 5–15)
AST: 69 U/L — ABNORMAL HIGH (ref 15–41)
BUN: 7 mg/dL (ref 6–20)
CHLORIDE: 101 mmol/L (ref 101–111)
CO2: 29 mmol/L (ref 22–32)
Calcium: 9.3 mg/dL (ref 8.9–10.3)
Creatinine, Ser: 0.96 mg/dL (ref 0.61–1.24)
GFR calc Af Amer: >60 mL/min (ref >60)
GFR calc non Af Amer: >60 mL/min (ref >60)
GLUCOSE: 100 mg/dL — AB (ref 65–99)
POTASSIUM: 3.6 mmol/L (ref 3.5–5.1)
SODIUM: 136 mmol/L (ref 135–145)
Total Bilirubin: 0.9 mg/dL (ref 0.3–1.2)
Total Protein: 8 g/dL (ref 6.5–8.1)

## 2017-04-18 LAB — TROPONIN I
Troponin I: 6.16 ng/mL (ref <0.03)
Troponin I: 6.47 ng/mL (ref <0.03)

## 2017-04-18 LAB — URINE DRUG SCREEN, QUALITATIVE (ARMC ONLY)
Amphetamines, Ur Screen: NOT DETECTED
BARBITURATES, UR SCREEN: NOT DETECTED
BENZODIAZEPINE, UR SCRN: NOT DETECTED
CANNABINOID 50 NG, UR ~~LOC~~: NOT DETECTED
COCAINE METABOLITE, UR ~~LOC~~: NOT DETECTED
MDMA (Ecstasy)Ur Screen: NOT DETECTED
METHADONE SCREEN, URINE: NOT DETECTED
OPIATE, UR SCREEN: NOT DETECTED
Phencyclidine (PCP) Ur S: NOT DETECTED
TRICYCLIC, UR SCREEN: NOT DETECTED

## 2017-04-18 LAB — PROTIME-INR
INR: 1.01
Prothrombin Time: 13.2 seconds (ref 11.4–15.2)

## 2017-04-18 LAB — APTT: APTT: 32 s (ref 24–36)

## 2017-04-18 LAB — HEPARIN LEVEL (UNFRACTIONATED): Heparin Unfractionated: 0.3 IU/mL (ref 0.30–0.70)

## 2017-04-18 MED ORDER — PANTOPRAZOLE SODIUM 40 MG IV SOLR
40.0000 mg | Freq: Two times a day (BID) | INTRAVENOUS | Status: DC
  Administered 2017-04-18 – 2017-04-19 (×3): 40 mg via INTRAVENOUS
  Filled 2017-04-18 (×3): qty 40

## 2017-04-18 MED ORDER — SODIUM CHLORIDE 0.9 % IV SOLN
INTRAVENOUS | Status: DC
  Administered 2017-04-18: 18:00:00 via INTRAVENOUS

## 2017-04-18 MED ORDER — NITROGLYCERIN 0.4 MG SL SUBL: 0.4000 mg | SUBLINGUAL_TABLET | SUBLINGUAL | Status: DC | PRN

## 2017-04-18 MED ORDER — HEPARIN SODIUM (PORCINE) 5000 UNIT/ML IJ SOLN
4000.0000 [IU] | Freq: Once | INTRAMUSCULAR | Status: DC
Start: 2017-04-18 — End: 2017-04-18

## 2017-04-18 MED ORDER — HYDROCODONE-ACETAMINOPHEN 5-325 MG PO TABS
1.0000 | ORAL_TABLET | ORAL | Status: DC | PRN
  Administered 2017-04-19: 2 via ORAL
  Administered 2017-04-19: 1 via ORAL
  Administered 2017-04-19: 2 via ORAL
  Administered 2017-04-19: 1 via ORAL
  Filled 2017-04-18: qty 2
  Filled 2017-04-18: qty 1
  Filled 2017-04-18: qty 2
  Filled 2017-04-18: qty 1

## 2017-04-18 MED ORDER — CLOPIDOGREL BISULFATE 75 MG PO TABS
600.0000 mg | ORAL_TABLET | Freq: Once | ORAL | Status: AC
  Administered 2017-04-18: 600 mg via ORAL

## 2017-04-18 MED ORDER — ONDANSETRON HCL 4 MG/2ML IJ SOLN: 4.0000 mg | Freq: Four times a day (QID) | INTRAMUSCULAR | Status: DC | PRN

## 2017-04-18 MED ORDER — ACETAMINOPHEN 650 MG RE SUPP: 650.0000 mg | Freq: Four times a day (QID) | RECTAL | Status: DC | PRN

## 2017-04-18 MED ORDER — ONDANSETRON HCL 4 MG PO TABS: 4.0000 mg | ORAL_TABLET | Freq: Four times a day (QID) | ORAL | Status: DC | PRN

## 2017-04-18 MED ORDER — CLOPIDOGREL BISULFATE 75 MG PO TABS: 600.0000 mg | ORAL_TABLET | Freq: Once | ORAL | Status: DC

## 2017-04-18 MED ORDER — HEPARIN (PORCINE) IN NACL 100-0.45 UNIT/ML-% IJ SOLN
1100.0000 [IU]/h | INTRAMUSCULAR | Status: DC
  Administered 2017-04-18: 950 [IU]/h via INTRAVENOUS
  Administered 2017-04-19: 1100 [IU]/h via INTRAVENOUS
  Filled 2017-04-18 (×2): qty 250

## 2017-04-18 MED ORDER — HEPARIN (PORCINE) IN NACL 100-0.45 UNIT/ML-% IJ SOLN
950.0000 [IU]/h | Freq: Once | INTRAMUSCULAR | Status: DC
  Filled 2017-04-18: qty 250

## 2017-04-18 MED ORDER — ACETAMINOPHEN 325 MG PO TABS: 650.0000 mg | ORAL_TABLET | Freq: Four times a day (QID) | ORAL | Status: DC | PRN

## 2017-04-18 MED ORDER — IBUPROFEN 800 MG PO TABS
800.0000 mg | ORAL_TABLET | Freq: Once | ORAL | Status: AC
  Administered 2017-04-18: 800 mg via ORAL

## 2017-04-18 MED ORDER — CLOPIDOGREL BISULFATE 75 MG PO TABS
75.0000 mg | ORAL_TABLET | Freq: Every day | ORAL | Status: DC
  Administered 2017-04-19: 75 mg via ORAL
  Filled 2017-04-18: qty 1

## 2017-04-18 MED ORDER — DOCUSATE SODIUM 100 MG PO CAPS
100.0000 mg | ORAL_CAPSULE | Freq: Two times a day (BID) | ORAL | Status: DC
  Administered 2017-04-19: 100 mg via ORAL
  Filled 2017-04-18: qty 1

## 2017-04-18 MED ORDER — HEPARIN SODIUM (PORCINE) 5000 UNIT/ML IJ SOLN
4000.0000 [IU] | Freq: Once | INTRAMUSCULAR | Status: AC
  Administered 2017-04-18: 4000 [IU] via INTRAVENOUS

## 2017-04-18 MED ORDER — NITROGLYCERIN 2 % TD OINT
1.0000 [in_us] | TOPICAL_OINTMENT | Freq: Four times a day (QID) | TRANSDERMAL | Status: DC
  Administered 2017-04-18 (×2): 1 [in_us] via TOPICAL
  Filled 2017-04-18 (×2): qty 1

## 2017-04-18 MED ORDER — BISACODYL 10 MG RE SUPP: 10.0000 mg | Freq: Every day | RECTAL | Status: DC | PRN

## 2017-04-18 NOTE — ED Provider Notes (Signed)
[facility]    CSN: [id number] Arrival date & time: 04/18/17  1157     History   Chief Complaint Chief Complaint  Patient presents with  . Chest Pain    HPI James Mcconnell is a 31 y.o. male.   The history is provided by the patient.  Chest Pain  Pain location:  Substernal area, L chest and L lateral chest Pain quality: aching and burning   Pain radiates to:  L shoulder and L jaw Pain severity:  Moderate Onset quality:  Sudden Duration:  5 days Timing:  Constant Progression:  Worsening Chronicity:  New Context: breathing   Relieved by: ibuprofen. Worsened by:  Deep breathing Associated symptoms: shortness of breath   Associated symptoms: no abdominal pain, no AICD problem, no altered mental status, no anorexia, no anxiety, no back pain, no claudication, no cough, no diaphoresis, no dizziness, no dysphagia, no fatigue, no fever, no headache, no heartburn, no lower extremity edema, no nausea, no near-syncope, no numbness, no orthopnea, no palpitations, no PND, no syncope, no vomiting and no weakness   Risk factors: male sex and smoking   Risk factors: no aortic disease, no birth control, no coronary artery disease, no diabetes mellitus, no Ehlers-Danlos syndrome, no high cholesterol, no hypertension, no immobilization, no Marfan's syndrome, not obese, not pregnant, no prior DVT/PE and no surgery     History reviewed. No pertinent past medical history.  There are no active problems to display for this patient.   Past Surgical History:  Procedure Laterality Date  . NO PAST SURGERIES         Home Medications    Prior to Admission medications   Medication Sig Start Date End Date Taking? Authorizing Provider  acetaminophen (TYLENOL) 325 MG tablet Take 650 mg by mouth every 6 (six) hours as needed.   Yes [provider]  naproxen sodium (ANAPROX) 220 MG tablet Take 440 mg by mouth 2 (two) times daily with a meal.    [provider]     Family History History reviewed. No pertinent family history.  Social History Social History  Substance Use Topics  . Smoking status: Current Every Day Smoker    Packs/day: 0.50  . Smokeless tobacco: Never Used  . Alcohol use Yes     Allergies   Aspirin   Review of Systems Review of Systems  Constitutional: Negative for diaphoresis, fatigue and fever.  HENT: Negative for trouble swallowing.   Respiratory: Positive for shortness of breath. Negative for cough.   Cardiovascular: Positive for chest pain. Negative for palpitations, orthopnea, claudication, syncope, PND and near-syncope.  Gastrointestinal: Negative for abdominal pain, anorexia, heartburn, nausea and vomiting.  Musculoskeletal: Negative for back pain.  Neurological: Negative for dizziness, weakness, numbness and headaches.     Physical Exam Triage Vital Signs ED Triage Vitals  Enc Vitals Group     BP 04/18/17 1208 136/81     Pulse Rate 04/18/17 1208 84     Resp 04/18/17 1208 16     Temp 04/18/17 1208 97.9 F (36.6 C)     Temp Source 04/18/17 1208 Oral     SpO2 04/18/17 1208 99 %     Weight 04/18/17 1206 205 lb (93 kg)     Height 04/18/17 1206  (1.702 m)     Head Circumference --      Peak Flow --      Pain Score 04/18/17 1207 9     Pain Loc --  Pain Edu? --      Excl. in GC? --    No data found.   Updated Vital Signs BP 136/81 (BP Location: Left Arm)   Pulse (!) 46   Temp 97.9 F (36.6 C) (Oral)   Resp 16   Ht  (1.702 m)   Wt 205 lb (93 kg)   SpO2 99%   BMI 32.11 kg/m   Visual Acuity Right Eye Distance:   Left Eye Distance:   Bilateral Distance:    Right Eye Near:   Left Eye Near:    Bilateral Near:     Physical Exam  Constitutional: He appears well-developed and well-nourished. No distress.  HENT:  Head: Normocephalic and atraumatic.  Right Ear: Tympanic membrane, external ear and ear canal normal.  Left Ear: Tympanic membrane, external ear and ear canal  normal.  Nose: Nose normal.  Mouth/Throat: Uvula is midline, oropharynx is clear and moist and mucous membranes are normal. No oropharyngeal exudate or tonsillar abscesses.  Eyes: Pupils are equal, round, and reactive to light. Conjunctivae and EOM are normal. Right eye exhibits no discharge. Left eye exhibits no discharge. No scleral icterus.  Neck: Normal range of motion. Neck supple. No tracheal deviation present. No thyromegaly present.  Cardiovascular: Regular rhythm and normal heart sounds.  Bradycardia present.   Pulmonary/Chest: Effort normal and breath sounds normal. No stridor. No respiratory distress. He has no wheezes. He has no rales. He exhibits no tenderness.  Lymphadenopathy:    He has no cervical adenopathy.  Neurological: He is alert.  Skin: Skin is warm and dry. No rash noted. He is not diaphoretic.  Nursing note and vitals reviewed.    UC Treatments / Results  Labs (all labs ordered are listed, but only abnormal results are displayed) Labs Reviewed - No data to display  EKG  EKG Interpretation None       Radiology No results found.  Procedures ED EKG Date/Time: 04/18/2017 12:41 PM Performed by: Payton Mccallum Authorized by: Payton Mccallum   ECG reviewed by ED Physician in the absence of a cardiologist: yes   Previous ECG:    Previous ECG:  Unavailable Interpretation:    Interpretation: abnormal   Rate:    ECG rate:  48   ECG rate assessment: bradycardic   Rhythm:    Rhythm: sinus bradycardia   Ectopy:    Ectopy: none   QRS:    QRS axis:  Normal Conduction:    Conduction: normal   ST segments:    ST segments:  Normal T waves:    T waves: normal      (including critical care time)  Medications Ordered in UC Medications  ibuprofen (ADVIL,MOTRIN) tablet 800 mg (800 mg Oral Given 04/18/17 1235)     Initial Impression / Assessment and Plan / UC Course  I have reviewed the triage vital signs and the nursing notes.  Pertinent labs &  imaging results that were available during my care of the patient were reviewed by me and considered in my medical decision making (see chart for details).       Final Clinical Impressions(s) / UC Diagnoses   Final diagnoses:  Chest pain, unspecified type  Bradycardia, unspecified  (unknown etiology)  New Prescriptions Discharge Medication List as of 04/18/2017 12:56 PM     1. ekg results reviewed with patient; recommend patient go to ED by EMS for further evaluation and management  Controlled Substance Prescriptions [place] Controlled Substance Registry consulted? Not Applicable  Payton Mccallum, MD 04/18/17 [phone]

## 2017-04-18 NOTE — Progress Notes (Signed)
ANTICOAGULATION CONSULT NOTE - Initial Consult  Pharmacy Consult for Heparin Drip  Indication: chest pain/ACS  Allergies  Allergen Reactions  . Aspirin Hives    Facial swelling   . Other Itching and Swelling    Pt reports allergy to chives. Pt reports eye swelling.    Patient Measurements: Height:  (170.2 cm) Weight: 205 lb (93 kg) IBW/kg (Calculated) : 66.1 Heparin Dosing Weight: 82 kg  Vital Signs: Temp: 98.4 F (36.9 C) (09/15 1325) Temp Source: Oral (09/15 1325) BP: 140/89 (09/15 1327) Pulse Rate: 48 (09/15 1325)  Labs:  Recent Labs  04/18/17 1338 04/18/17 1439  HGB 14.8  --   HCT 42.9  --   PLT 245  --   APTT  --  32  LABPROT  --  13.2  INR  --  1.01  CREATININE 0.96  --   TROPONINI 6.16*  --     Estimated Creatinine Clearance: 122.4 mL/min (by C-G formula based on SCr of 0.96 mg/dL).   Medical History: History reviewed. No pertinent past medical history.  Assessment: Pharmacy consulted for heparin drip dosing and monitoring for 31 yo patient with ACS/Chest Pain.  Troponin I: 6.16   Goal of Therapy:  Heparin level 0.3-0.7 units/ml Monitor platelets by anticoagulation protocol: Yes   Plan:  Baseline labs orders  Give 4000 units bolus x 1 Start heparin infusion at 950 units/hr Check anti-Xa level in 6 hours and daily while on heparin Continue to monitor H&H and platelets  Gardner Candle, PharmD, BCPS Clinical Pharmacist 04/18/2017 3:26 PM

## 2017-04-18 NOTE — ED Triage Notes (Signed)
Patient complains of chest pain across chest. Patient states that the pain radiates up his throat and in to the top of his left arm. Patient states that the pain started on Monday and he has been able to keep pain down with ibuprofen.

## 2017-04-18 NOTE — ED Triage Notes (Signed)
Pt brought in by [facility] from [facility] with c/o chest pain intermittently for the past week.  Pt states that he has relief with ibuprofen.  Pt states that the pain is happening more often.  Pt received  ibuprofen at [facility] today.  Pt also states that he feels the pain in his throat and on his L shoulder.

## 2017-04-18 NOTE — Consult Note (Signed)
Reason for Consult:non-STEMI elevated troponins Referring Physician: Dr. Georgie Chard hospitalist, Dr. Cinda [facility]  James Mcconnell is an 31 y.o. male.  HPI: presents with significant icterus smoking aspirin allergy started having chest discomfort on Monday off and on intermittently but this morning he pain started and wouldn't go away and he was unable to sleep so finally went to the emergency room. Patient finally went to urgent care He had the slightly equal, EKG with significant chest pain was given Motrin referred to the ER at [facility].Patient had abnormal EKG and elevated troponinf 6.patient complains of intermittent hest pain which is significantly improved after being placed onheparin and he was given NTG. Patient feels much improved now with no significant   History reviewed. No pertinent past medical history.  Past Surgical History:  Procedure Laterality Date  . NO PAST SURGERIES      History reviewed. No pertinent family history.  Social History:  reports that he has been smoking.  He has been smoking about 0.50 packs per day. He has never used smokeless tobacco. He reports that he drinks alcohol. He reports that he does not use drugs.  Allergies:  Allergies  Allergen Reactions  . Aspirin Hives    Facial swelling   . Other Itching and Swelling    Pt reports allergy to chives. Pt reports eye swelling.    Medications: I have reviewed the patient's current medications.  Results for orders placed or performed during the hospital encounter of 04/18/17 (from the past 48 hour(s))  Comprehensive metabolic panel     Status: Abnormal   Collection Time: 04/18/17  1:38 PM  Result Value Ref Range   Sodium 136 135 - 145 mmol/L   Potassium 3.6 3.5 - 5.1 mmol/L   Chloride 101 101 - 111 mmol/L   CO2 29 22 - 32 mmol/L   Glucose, Bld 100 (H) 65 - 99 mg/dL   BUN 7 6 - 20 mg/dL   Creatinine, Ser 0.96 0.61 - 1.24 mg/dL   Calcium 9.3 8.9 - 10.3 mg/dL   Total Protein 8.0 6.5 - 8.1 g/dL   Albumin 4.1 3.5 - 5.0 g/dL   AST 69 (H) 15 - 41 U/L   ALT 61 17 - 63 U/L   Alkaline Phosphatase 77 38 - 126 U/L   Total Bilirubin 0.9 0.3 - 1.2 mg/dL   GFR calc non Af Amer >60 >60 mL/min   GFR calc Af Amer >60 >60 mL/min    Comment: (NOTE) The eGFR has been calculated using the CKD EPI equation. This calculation has not been validated in all clinical situations. eGFR's persistently <60 mL/min signify possible Chronic Kidney Disease.    Anion gap 6 5 - 15  Troponin I     Status: Abnormal   Collection Time: 04/18/17  1:38 PM  Result Value Ref Range   Troponin I 6.16 (HH) <0.03 ng/mL    Comment: CRITICAL RESULT CALLED TO, READ BACK BY AND VERIFIED WITH IRIS GUIDRY ON 04/18/17 AT AT 1435 [facility]   CBC with Differential     Status: None   Collection Time: 04/18/17  1:38 PM  Result Value Ref Range   WBC 6.6 3.8 - 10.6 K/uL   RBC 5.14 4.40 - 5.90 MIL/uL   Hemoglobin 14.8 13.0 - 18.0 g/dL   HCT 42.9 40.0 - 52.0 %   MCV 83.5 80.0 - 100.0 fL   MCH 28.8 26.0 - 34.0 pg   MCHC 34.5 32.0 - 36.0 g/dL   RDW 13.0 11.5 -  14.5 %   Platelets 245 150 - 440 K/uL   Neutrophils Relative % 70 %   Neutro Abs 4.6 1.4 - 6.5 K/uL   Lymphocytes Relative 22 %   Lymphs Abs 1.4 1.0 - 3.6 K/uL   Monocytes Relative 6 %   Monocytes Absolute 0.4 0.2 - 1.0 K/uL   Eosinophils Relative 2 %   Eosinophils Absolute 0.1 0 - 0.7 K/uL   Basophils Relative 0 %   Basophils Absolute 0.0 0 - 0.1 K/uL  APTT     Status: None   Collection Time: 04/18/17  2:39 PM  Result Value Ref Range   aPTT 32 24 - 36 seconds  Protime-INR     Status: None   Collection Time: 04/18/17  2:39 PM  Result Value Ref Range   Prothrombin Time 13.2 11.4 - 15.2 seconds   INR 1.01     Dg Chest Portable 1 View  Result Date: 04/18/2017 CLINICAL DATA:  Radiating chest pain.  Current smoker. EXAM: PORTABLE CHEST 1 VIEW COMPARISON:  None. FINDINGS: Normal cardiac silhouette and mediastinal contours given AP projection. No focal airspace opacities.  No pleural effusion or pneumothorax. No evidence of edema. No acute osseus abnormalities. IMPRESSION: No acute cardiopulmonary disease on this AP portable examination. Further evaluation with a PA and lateral chest radiograph may be obtained as clinically indicated. Electronically Signed   By: Sandi Mariscal M.D.   On: 04/18/2017 13:51    Review of Systems  HENT: Negative.   Eyes: Negative.   Respiratory: Positive for shortness of breath.   Cardiovascular: Positive for chest pain.  Gastrointestinal: Negative.   Genitourinary: Negative.   Musculoskeletal: Negative.   Skin: Negative.   Neurological: Positive for weakness.  Endo/Heme/Allergies: Negative.   Psychiatric/Behavioral: Negative.    Blood pressure (!) 136/109, pulse 63, temperature 98.3 F (36.8 C), temperature source Oral, resp. rate 18, height '5\' 7"'$  (1.702 m), weight 199 lb 12.8 oz (90.6 kg), SpO2 98 %. Physical Exam  Nursing note and vitals reviewed. Constitutional: He is oriented to person, place, and time. He appears well-developed and well-nourished.  HENT:  Head: Normocephalic and atraumatic.  Eyes: Pupils are equal, round, and reactive to light. Conjunctivae and EOM are normal.  Neck: Normal range of motion. Neck supple.  Cardiovascular: Normal rate, regular rhythm and normal heart sounds.   Respiratory: Effort normal and breath sounds normal.  GI: Soft.  Musculoskeletal: Normal range of motion.  Neurological: He is alert and oriented to person, place, and time. He has normal reflexes.  Skin: Skin is warm and dry.  Psychiatric: He has a normal mood and affect.    Assessment/Plan: Non-STEMI Elevated troponin Unstable angina Chest pain Smoking Abnormal E DJD . Plan Agree with admit to telemetry Follow-up further cardiac enzymes and EKGs Recommend continue IV heparin therapy Recommend load with Plavix Aspirin allergy because of hives and itching Echocardiogram will be helpful for further assessment Okay to  continue NSAIDs as needed EKG does not meet criteria for STEMI so will defer catheter for the next 24-48 hours Recommend cardiac catheter prior to discharge  [name] 04/18/2017, 6:13 PM

## 2017-04-18 NOTE — ED Notes (Signed)
Pt reports that he normally works out regularly, but hasn't worked out since pain started.  Pt's pain is not worse with palpation and pt reports improvement with deep breathing.

## 2017-04-18 NOTE — H&P (Signed)
History and Physical    James Mcconnell ZOX:[id number] DOB: 1986/03/13 DOA: 04/18/2017  Referring physician: Dr. Darnelle Catalan PCP: Patient, No Pcp Per  Specialists: none  Chief Complaint: chest pain  HPI: James Mcconnell is a 31 y.o. male who has no significant past medical history who presents to ER with chestpain. The pt has 2 types of CP. One type is "knocking" and lasts less than a minute. The 2nd type of CP is pressure and fullness in his chest radiating to left arm lasting 30-60 minutes. Currently he is pain-free. In the ER, EKG is abnormal with minimal ST changes but troponin=6.16. He is now admitted. No previous cardiac hx. No N/V/D. Denies SOB, palpitations, or sweats  Review of Systems: The patient denies anorexia, fever, weight loss,, vision loss, decreased hearing, hoarseness, syncope, dyspnea on exertion, peripheral edema, balance deficits, hemoptysis, abdominal pain, melena, hematochezia, severe indigestion/heartburn, hematuria, incontinence, genital sores, muscle weakness, suspicious skin lesions, transient blindness, difficulty walking, depression, unusual weight change, abnormal bleeding, enlarged lymph nodes, angioedema, and breast masses.   History reviewed. No pertinent past medical history. Past Surgical History:  Procedure Laterality Date  . NO PAST SURGERIES     Social History:  reports that he has been smoking.  He has been smoking about 0.50 packs per day. He has never used smokeless tobacco. He reports that he drinks alcohol. He reports that he does not use drugs.  Allergies  Allergen Reactions  . Aspirin Hives    Facial swelling   . Other Itching and Swelling    Pt reports allergy to chives. Pt reports eye swelling.    History reviewed. No pertinent family history.  Prior to Admission medications   Medication Sig Start Date End Date Taking? Authorizing Provider  ibuprofen (ADVIL,MOTRIN) 100 MG tablet Take 100 mg by mouth every 6 (six) hours as needed for fever.    Yes [provider]  acetaminophen (TYLENOL) 325 MG tablet Take 650 mg by mouth every 6 (six) hours as needed.    [provider]  naproxen sodium (ANAPROX) 220 MG tablet Take 440 mg by mouth 2 (two) times daily with a meal.    [provider]   Physical Exam: Vitals:   04/18/17 1325 04/18/17 1326 04/18/17 1327  BP:   140/89  Pulse: (!) 48    Resp: 16    Temp: 98.4 F (36.9 C)    TempSrc: Oral    SpO2: 99%    Weight:  93 kg (205 lb)   Height:   (1.702 m)      General:  No apparent distress, WDWN, [place]/AT  Eyes: PERRL, EOMI, no scleral icterus, conjunctiva clear  ENT: moist oropharynx without exudate, TM's benign, dentition good  Neck: supple, no lymphadenopathy. No bruits or thyromegaly  Cardiovascular: regular rate without MRG; 2+ peripheral pulses, no JVD, no peripheral edema  Respiratory: CTA biL, good air movement without wheezing, rhonchi or crackled. Respiratory effort is normal  Abdomen: soft, non tender to palpation, positive bowel sounds, no guarding, no rebound  Skin: no rashes or lesions  Musculoskeletal: normal bulk and tone, no joint swelling  Psychiatric: normal mood and affect, A&OX3  Neurologic: CN 2-12 grossly intact, Motor strength 5/5 in all 4 groups with symmetric DTR's and non-focal sensory exam  Labs on Admission:  Basic Metabolic Panel:  Recent Labs Lab 04/18/17 1338  NA 136  K 3.6  CL 101  CO2 29  GLUCOSE 100*  BUN 7  CREATININE 0.96  CALCIUM 9.3  Liver Function Tests:  Recent Labs Lab 04/18/17 1338  AST 69*  ALT 61  ALKPHOS 77  BILITOT 0.9  PROT 8.0  ALBUMIN 4.1   No results for input(s): LIPASE, AMYLASE in the last 168 hours. No results for input(s): AMMONIA in the last 168 hours. CBC:  Recent Labs Lab 04/18/17 1338  WBC 6.6  NEUTROABS 4.6  HGB 14.8  HCT 42.9  MCV 83.5  PLT 245   Cardiac Enzymes:  Recent Labs Lab 04/18/17 1338  TROPONINI 6.16*    BNP (last 3 results) No  results for input(s): BNP in the last 8760 hours.  ProBNP (last 3 results) No results for input(s): PROBNP in the last 8760 hours.  CBG: No results for input(s): GLUCAP in the last 168 hours.  Radiological Exams on Admission: Dg Chest Portable 1 View  Result Date: 04/18/2017 CLINICAL DATA:  Radiating chest pain.  Current smoker. EXAM: PORTABLE CHEST 1 VIEW COMPARISON:  None. FINDINGS: Normal cardiac silhouette and mediastinal contours given AP projection. No focal airspace opacities. No pleural effusion or pneumothorax. No evidence of edema. No acute osseus abnormalities. IMPRESSION: No acute cardiopulmonary disease on this AP portable examination. Further evaluation with a PA and lateral chest radiograph may be obtained as clinically indicated. Electronically Signed   By: Simonne Come M.D.   On: 04/18/2017 13:51    EKG: Independently reviewed.  Assessment/Plan Principal Problem:   NSTEMI (non-ST elevated myocardial infarction) (HCC) Active Problems:   Abnormal EKG   Chest pain   HTN (hypertension)   Will admit to telemetry with Heparin drip, NTP, and Plavix. Follow enzymes. Order echo. [facility] for cath. Repeat labs in AM. Optimize BP regimen  Diet: low salt Fluids: NS@75  DVT Prophylaxis: IV Heparin  Code Status: FULL  Family Communication: yes  Disposition Plan: home  Time spent: 55 min

## 2017-04-18 NOTE — Progress Notes (Signed)
ANTICOAGULATION CONSULT NOTE - Initial Consult  Pharmacy Consult for Heparin Drip  Indication: chest pain/ACS  Allergies  Allergen Reactions  . Aspirin Hives    Facial swelling   . Other Itching and Swelling    Pt reports allergy to chives. Pt reports eye swelling.    Patient Measurements: Height:  (170.2 cm) Weight: 199 lb 12.8 oz (90.6 kg) IBW/kg (Calculated) : 66.1 Heparin Dosing Weight: 82 kg  Vital Signs: Temp: 97.9 F (36.6 C) (09/15 2015) Temp Source: Oral (09/15 2015) BP: 110/45 (09/15 2015) Pulse Rate: 60 (09/15 2015)  Labs:  Recent Labs  04/18/17 1338 04/18/17 1439 04/18/17 1804 04/18/17 2046  HGB 14.8  --   --   --   HCT 42.9  --   --   --   PLT 245  --   --   --   APTT  --  32  --   --   LABPROT  --  13.2  --   --   INR  --  1.01  --   --   HEPARINUNFRC  --   --   --  0.30  CREATININE 0.96  --   --   --   TROPONINI 6.16*  --  6.47*  --     Estimated Creatinine Clearance: 120.8 mL/min (by C-G formula based on SCr of 0.96 mg/dL).   Medical History: History reviewed. No pertinent past medical history.  Assessment: Pharmacy consulted for heparin drip dosing and monitoring for 31 yo patient with ACS/Chest Pain.  Troponin I: 6.16   Goal of Therapy:  Heparin level 0.3-0.7 units/ml Monitor platelets by anticoagulation protocol: Yes   Plan:  Baseline labs orders  Give 4000 units bolus x 1 Start heparin infusion at 950 units/hr Check anti-Xa level in 6 hours and daily while on heparin Continue to monitor H&H and platelets   09/15 2046 HL 0.30. Level is just barely therapeutic. Will increase infusion to heparin 1000units/hr and recheck HL in 6 hours.   Gardner Candle, PharmD, BCPS Clinical Pharmacist 04/18/2017 9:46 PM

## 2017-04-18 NOTE — ED Notes (Signed)
Date and time results received: 04/18/17 1430  Test: Troponin Critical Value: 6.16  Name of Provider Notified: Dr. Darnelle Catalan  Orders Received? Or Actions Taken?: VO for heparin 4000units IV bolus, heparin drip and  plavix.  EDP calling cardiologist and in to speak with pt and family

## 2017-04-18 NOTE — ED Provider Notes (Addendum)
[facility] Emergency Department Provider Note   ____________________________________________   First MD Initiated Contact with Patient 04/18/17 1323     (approximate)  I have reviewed the triage vital signs and the nursing notes.   HISTORY  Chief Complaint No chief complaint on file. Chief complaint is chest pain  HPI James Mcconnell is a 31 y.o. male who reports he's been having chest pain fairly constantly since Monday about a week. Chest pain is in the center of his chest and occasionally radiates to the left shoulder or upper anterior neck. It goes away with ibuprofen but really doesn't seem to go away otherwise although when he woke up this morning was not present. It feels tight. As opposed to what it was reported and 11 urgent care patient tells me that deep breathing makes it better. He is not short of breath. He has no nausea or sweating. Nothing he does seem to make it better or worse. He usually goes to the gym and works out daily but when he woke up on Monday he didn't feel good and had this chest pain so did not go to the gym and has not worked out since then. His heart rate is in the 40s or 50s here in the emergency room. Again the pain went away with Motrin. He has no pain right now.   History reviewed. No pertinent past medical history. Patient has no medical history denies any high blood pressure diabetes or any other problems. There are no active problems to display for this patient.   Past Surgical History:  Procedure Laterality Date  . NO PAST SURGERIES      Prior to Admission medications   Medication Sig Start Date End Date Taking? Authorizing Provider  acetaminophen (TYLENOL) 325 MG tablet Take 650 mg by mouth every 6 (six) hours as needed.    [provider]  naproxen sodium (ANAPROX) 220 MG tablet Take 440 mg by mouth 2 (two) times daily with a meal.    [provider]    Allergies Aspirin  No family history on  file.  Social History Social History  Substance Use Topics  . Smoking status: Current Every Day Smoker    Packs/day: 0.50  . Smokeless tobacco: Never Used  . Alcohol use Yes     Comment: occasional    Review of Systems  Constitutional: No fever/chills Eyes: No visual changes. ENT: No sore throat. Cardiovascular:  chest pain. Respiratory: Denies shortness of breath. Gastrointestinal: No abdominal pain.  No nausea, no vomiting.  No diarrhea.  No constipation. Genitourinary: Negative for dysuria. Musculoskeletal: Negative for back pain. Skin: Negative for rash. Neurological: Negative for headaches, focal weakness   ____________________________________________   PHYSICAL EXAM:  VITAL SIGNS: ED Triage Vitals  Enc Vitals Group     BP 04/18/17 1327 140/89     Pulse Rate 04/18/17 1325 (!) 48     Resp 04/18/17 1325 16     Temp 04/18/17 1325 98.4 F (36.9 C)     Temp Source 04/18/17 1325 Oral     SpO2 04/18/17 1325 99 %     Weight 04/18/17 1326 205 lb (93 kg)     Height 04/18/17 1326  (1.702 m)     Head Circumference --      Peak Flow --      Pain Score 04/18/17 1327 5     Pain Loc --      Pain Edu? --  Excl. in GC? --     Constitutional: Alert and oriented. Well appearing and in no acute distress. Eyes: Conjunctivae are normal.  Head: Atraumatic. Nose: No congestion/rhinnorhea. Mouth/Throat: Mucous membranes are moist.  Oropharynx non-erythematous. Neck: No stridor.  Cardiovascular: Normal rate, regular rhythm. Grossly normal heart sounds.  Good peripheral circulation. Respiratory: Normal respiratory effort.  No retractions. Lungs CTAB. Gastrointestinal: Soft and nontender. No distention. No abdominal bruits. No CVA tenderness. Musculoskeletal: No lower extremity tenderness nor edema.  No joint effusions. Neurologic:  Normal speech and language. No gross focal neurologic deficits are appreciated. No gait instability. Skin:  Skin is warm, dry and intact. No  rash noted. Psychiatric: Mood and affect are normal. Speech and behavior are normal.  ____________________________________________   LABS (all labs ordered are listed, but only abnormal results are displayed)  Labs Reviewed  COMPREHENSIVE METABOLIC PANEL - Abnormal; Notable for the following:       Result Value   Glucose, Bld 100 (*)    AST 69 (*)    All other components within normal limits  TROPONIN I - Abnormal; Notable for the following:    Troponin I 6.16 (*)    All other components within normal limits  CBC WITH DIFFERENTIAL/PLATELET   ____________________________________________  EKG  EKG read and interpreted by me shows normal lites shows sinus bradycardia at a rate of 53 left axis minimal ST segment elevation in 3 and F ____________________________________________  RADIOLOGY  Dg Chest Portable 1 View  Result Date: 04/18/2017 CLINICAL DATA:  Radiating chest pain.  Current smoker. EXAM: PORTABLE CHEST 1 VIEW COMPARISON:  None. FINDINGS: Normal cardiac silhouette and mediastinal contours given AP projection. No focal airspace opacities. No pleural effusion or pneumothorax. No evidence of edema. No acute osseus abnormalities. IMPRESSION: No acute cardiopulmonary disease on this AP portable examination. Further evaluation with a PA and lateral chest radiograph may be obtained as clinically indicated. Electronically Signed   By: Simonne Come M.D.   On: 04/18/2017 13:51    ____________________________________________   PROCEDURES  Procedure(s) performed:  Procedures  Critical Care performed:   ____________________________________________   INITIAL IMPRESSION / ASSESSMENT AND PLAN / ED COURSE  Pertinent labs & imaging results that were available during my care of the patient were reviewed by me and considered in my medical decision making (see chart for details).  Troponin is elevated discussed with cardiologyDr. Antoine Poche  and he wanted me to call the  interventionalist. Initially we had Dr. Ann Maki shows on call but he was not really on call them to call Dr. Juliann Pares send him the EKGs the patient says he is pain-free EKGs did not meet criteria Dr. Jeanene Erb is reluctant to catheter the patient at this time wants me to call the hospitalist and the hospitalist will consult cardiology we will go from there. was called interventional list and see if he wants to be cathetered      ____________________________________________   FINAL CLINICAL IMPRESSION(S) / ED DIAGNOSES  Final diagnoses:  ACS (acute coronary syndrome) (HCC)      NEW MEDICATIONS STARTED DURING THIS VISIT:  New Prescriptions   No medications on file     Note:  This document was prepared using Dragon voice recognition software and may include unintentional dictation errors.    Arnaldo Natal, MD 04/18/17 1443    Arnaldo Natal, MD 04/18/17 [phone]

## 2017-04-19 ENCOUNTER — Inpatient Hospital Stay
Admit: 2017-04-19 | Discharge: 2017-04-19 | Disposition: A | Discharge status: Home / Self Care | Payer: BLUE CROSS/BLUE SHIELD | Attending: Internal Medicine | Admitting: Internal Medicine

## 2017-04-19 LAB — COMPREHENSIVE METABOLIC PANEL
ALK PHOS: 68 U/L (ref 38–126)
ALT: 56 U/L (ref 17–63)
AST: 64 U/L — AB (ref 15–41)
Albumin: 3.7 g/dL (ref 3.5–5.0)
Anion gap: 6 (ref 5–15)
BUN: 9 mg/dL (ref 6–20)
CALCIUM: 8.5 mg/dL — AB (ref 8.9–10.3)
CO2: 26 mmol/L (ref 22–32)
CREATININE: 0.9 mg/dL (ref 0.61–1.24)
Chloride: 105 mmol/L (ref 101–111)
GFR calc non Af Amer: >60 mL/min (ref >60)
GLUCOSE: 115 mg/dL — AB (ref 65–99)
Potassium: 3.5 mmol/L (ref 3.5–5.1)
SODIUM: 137 mmol/L (ref 135–145)
Total Bilirubin: 0.6 mg/dL (ref 0.3–1.2)
Total Protein: 7.1 g/dL (ref 6.5–8.1)

## 2017-04-19 LAB — CBC
HCT: 39.5 % — ABNORMAL LOW (ref 40.0–52.0)
Hemoglobin: 13.7 g/dL (ref 13.0–18.0)
MCH: 29.1 pg (ref 26.0–34.0)
MCHC: 34.7 g/dL (ref 32.0–36.0)
MCV: 83.9 fL (ref 80.0–100.0)
PLATELETS: 228 10*3/uL (ref 150–440)
RBC: 4.71 MIL/uL (ref 4.40–5.90)
RDW: 13.1 % (ref 11.5–14.5)
WBC: 6.3 10*3/uL (ref 3.8–10.6)

## 2017-04-19 LAB — HEPARIN LEVEL (UNFRACTIONATED)
HEPARIN UNFRACTIONATED: 0.35 [IU]/mL (ref 0.30–0.70)
Heparin Unfractionated: 0.37 IU/mL (ref 0.30–0.70)

## 2017-04-19 LAB — TROPONIN I
Troponin I: 5.46 ng/mL (ref <0.03)
Troponin I: 6.2 ng/mL (ref <0.03)

## 2017-04-19 MED ORDER — SODIUM CHLORIDE 0.9% FLUSH: 3.0000 mL | INTRAVENOUS | Status: DC | PRN

## 2017-04-19 MED ORDER — SODIUM CHLORIDE 0.9 % WEIGHT BASED INFUSION: 1.0000 mL/kg/h | INTRAVENOUS | Status: DC

## 2017-04-19 MED ORDER — SODIUM CHLORIDE 0.9 % IV SOLN: 250.0000 mL | INTRAVENOUS | Status: DC | PRN

## 2017-04-19 MED ORDER — SODIUM CHLORIDE 0.9% FLUSH: 3.0000 mL | Freq: Two times a day (BID) | INTRAVENOUS | Status: DC

## 2017-04-19 MED ORDER — CLOPIDOGREL BISULFATE 75 MG PO TABS
75.0000 mg | ORAL_TABLET | ORAL | Status: AC
  Administered 2017-04-20: 75 mg via ORAL
  Filled 2017-04-19: qty 1

## 2017-04-19 MED ORDER — ATORVASTATIN CALCIUM 20 MG PO TABS
80.0000 mg | ORAL_TABLET | Freq: Every day | ORAL | Status: DC
  Administered 2017-04-19: 80 mg via ORAL
  Filled 2017-04-19: qty 4

## 2017-04-19 MED ORDER — SODIUM CHLORIDE 0.9 % WEIGHT BASED INFUSION: 3.0000 mL/kg/h | INTRAVENOUS | Status: AC

## 2017-04-19 NOTE — Progress Notes (Signed)
ANTICOAGULATION CONSULT NOTE - Initial Consult  Pharmacy Consult for Heparin Drip  Indication: chest pain/ACS  Allergies  Allergen Reactions  . Aspirin Hives    Facial swelling   . Other Itching and Swelling    Pt reports allergy to chives. Pt reports eye swelling.    Patient Measurements: Height:  (170.2 cm) Weight: 200 lb 12.8 oz (91.1 kg) IBW/kg (Calculated) : 66.1 Heparin Dosing Weight: 82 kg  Vital Signs: Temp: 98.3 F (36.8 C) (09/16 1022) Temp Source: Oral (09/16 1022) BP: 125/73 (09/16 1022) Pulse Rate: 61 (09/16 1022)  Labs:  Recent Labs  04/18/17 1338 04/18/17 1439 04/18/17 1804 04/18/17 2046 04/18/17 2351 04/19/17 0452 04/19/17 1056  HGB 14.8  --   --   --   --  13.7  --   HCT 42.9  --   --   --   --  39.5*  --   PLT 245  --   --   --   --  228  --   APTT  --  32  --   --   --   --   --   LABPROT  --  13.2  --   --   --   --   --   INR  --  1.01  --   --   --   --   --   HEPARINUNFRC  --   --   --  0.30  --  0.35 0.37  CREATININE 0.96  --   --   --   --  0.90  --   TROPONINI 6.16*  --  6.47*  --  6.20* 5.46*  --     Estimated Creatinine Clearance: 129.2 mL/min (by C-G formula based on SCr of 0.9 mg/dL).   Medical History: History reviewed. No pertinent past medical history.  Assessment: Pharmacy consulted for heparin drip dosing and monitoring for 31 yo patient with ACS/Chest Pain.  Troponin I: 6.16   Goal of Therapy:  Heparin level 0.3-0.7 units/ml Monitor platelets by anticoagulation protocol: Yes   Plan:  Baseline labs orders  Give 4000 units bolus x 1 Start heparin infusion at 950 units/hr Check anti-Xa level in 6 hours and daily while on heparin Continue to monitor H&H and platelets   09/15 2046 HL 0.30. Level is just barely therapeutic. Will increase infusion to heparin 1000units/hr and recheck HL in 6 hours.   9/16 @ 0500 HL 0.35 therapeutic, but considering patient has trops of 6.0 will increase rate to 1100 units/hr and  will recheck HL @ 1100.  9/16: Heparin level within goal range again. Will recheck Heparin level with am labs.   Demetrius Charity, PharmD, BCPS Clinical Pharmacist 04/19/2017 12:15 PM

## 2017-04-19 NOTE — Progress Notes (Signed)
ANTICOAGULATION CONSULT NOTE - Initial Consult  Pharmacy Consult for Heparin Drip  Indication: chest pain/ACS  Allergies  Allergen Reactions  . Aspirin Hives    Facial swelling   . Other Itching and Swelling    Pt reports allergy to chives. Pt reports eye swelling.    Patient Measurements: Height:  (170.2 cm) Weight: 200 lb 12.8 oz (91.1 kg) IBW/kg (Calculated) : 66.1 Heparin Dosing Weight: 82 kg  Vital Signs: Temp: 98.6 F (37 C) (09/16 0353) Temp Source: Oral (09/16 0353) BP: 116/76 (09/16 0353) Pulse Rate: 59 (09/16 0353)  Labs:  Recent Labs  04/18/17 1338 04/18/17 1439 04/18/17 1804 04/18/17 2046 04/18/17 2351 04/19/17 0452  HGB 14.8  --   --   --   --  13.7  HCT 42.9  --   --   --   --  39.5*  PLT 245  --   --   --   --  228  APTT  --  32  --   --   --   --   LABPROT  --  13.2  --   --   --   --   INR  --  1.01  --   --   --   --   HEPARINUNFRC  --   --   --  0.30  --  0.35  CREATININE 0.96  --   --   --   --   --   TROPONINI 6.16*  --  6.47*  --  6.20*  --     Estimated Creatinine Clearance: 121.1 mL/min (by C-G formula based on SCr of 0.96 mg/dL).   Medical History: History reviewed. No pertinent past medical history.  Assessment: Pharmacy consulted for heparin drip dosing and monitoring for 31 yo patient with ACS/Chest Pain.  Troponin I: 6.16   Goal of Therapy:  Heparin level 0.3-0.7 units/ml Monitor platelets by anticoagulation protocol: Yes   Plan:  Baseline labs orders  Give 4000 units bolus x 1 Start heparin infusion at 950 units/hr Check anti-Xa level in 6 hours and daily while on heparin Continue to monitor H&H and platelets   09/15 2046 HL 0.30. Level is just barely therapeutic. Will increase infusion to heparin 1000units/hr and recheck HL in 6 hours.   9/16 @ 0500 HL 0.35 therapeutic, but considering patient has trops of 6.0 will increase rate to 1100 units/hr and will recheck HL @ 1100.  Thomasene Ripple, PharmD,  BCPS Clinical Pharmacist 04/19/2017 5:47 AM

## 2017-04-19 NOTE — Progress Notes (Signed)
[facility] - [place] [facility]   PATIENT NAME: James Mcconnell    MR#:  [id number]  DATE OF BIRTH:  [birth year]  SUBJECTIVE:   Patient here due to chest pain and ruled in for a non-ST elevation MI. Still complaining of some mild chest pain today but no shortness of breath nausea vomiting or diaphoresis associated with it.  REVIEW OF SYSTEMS:    Review of Systems  Constitutional: Negative for chills and fever.  HENT: Negative for congestion and tinnitus.   Eyes: Negative for blurred vision and double vision.  Respiratory: Negative for cough, shortness of breath and wheezing.   Cardiovascular: Positive for chest pain. Negative for orthopnea and PND.  Gastrointestinal: Negative for abdominal pain, diarrhea, nausea and vomiting.  Genitourinary: Negative for dysuria and hematuria.  Neurological: Negative for dizziness, sensory change and focal weakness.  All other systems reviewed and are negative.   Nutrition: Heart healthy Tolerating Diet: Yes Tolerating PT: Ambulatory   DRUG ALLERGIES:   Allergies  Allergen Reactions  . Aspirin Hives    Facial swelling   . Other Itching and Swelling    Pt reports allergy to chives. Pt reports eye swelling.    VITALS:  Blood pressure 125/73, pulse 61, temperature 98.3 F (36.8 C), temperature source Oral, resp. rate 17, height  (1.702 m), weight 91.1 kg (200 lb 12.8 oz), SpO2 98 %.  PHYSICAL EXAMINATION:   Physical Exam  GENERAL:  31 y.o.-year-old patient lying in bed in no acute distress.  EYES: Pupils equal, round, reactive to light and accommodation. No scleral icterus. Extraocular muscles intact.  HEENT: Head atraumatic, normocephalic. Oropharynx and nasopharynx clear.  NECK:  Supple, no jugular venous distention. No thyroid enlargement, no tenderness.  LUNGS: Normal breath sounds bilaterally, no wheezing, rales, rhonchi. No use of accessory muscles of respiration.  CARDIOVASCULAR: S1, S2 normal. No murmurs,  rubs, or gallops.  ABDOMEN: Soft, nontender, nondistended. Bowel sounds present. No organomegaly or mass.  EXTREMITIES: No cyanosis, clubbing or edema b/l.    NEUROLOGIC: Cranial nerves II through XII are intact. No focal Motor or sensory deficits b/l.   PSYCHIATRIC: The patient is alert and oriented x 3.  SKIN: No obvious rash, lesion, or ulcer.    LABORATORY PANEL:   CBC  Recent Labs Lab 04/19/17 0452  WBC 6.3  HGB 13.7  HCT 39.5*  PLT 228   ------------------------------------------------------------------------------------------------------------------  Chemistries   Recent Labs Lab 04/19/17 0452  NA 137  K 3.5  CL 105  CO2 26  GLUCOSE 115*  BUN 9  CREATININE 0.90  CALCIUM 8.5*  AST 64*  ALT 56  ALKPHOS 68  BILITOT 0.6   ------------------------------------------------------------------------------------------------------------------  Cardiac Enzymes  Recent Labs Lab 04/19/17 0452  TROPONINI 5.46*   ------------------------------------------------------------------------------------------------------------------  RADIOLOGY:  Dg Chest Portable 1 View  Result Date: 04/18/2017 CLINICAL DATA:  Radiating chest pain.  Current smoker. EXAM: PORTABLE CHEST 1 VIEW COMPARISON:  None. FINDINGS: Normal cardiac silhouette and mediastinal contours given AP projection. No focal airspace opacities. No pleural effusion or pneumothorax. No evidence of edema. No acute osseus abnormalities. IMPRESSION: No acute cardiopulmonary disease on this AP portable examination. Further evaluation with a PA and lateral chest radiograph may be obtained as clinically indicated. Electronically Signed   By: Simonne Come M.D.   On: 04/18/2017 13:51     ASSESSMENT AND PLAN:   31 year old male with no significant past medical history presented to the hospital due to chest pain and ruled in for a  non-ST elevation MI.  1. Non-ST elevation MI-patient presented with chest pain and has an  elevated troponin consistent with a non-ST elevation MI. Currently hemodynamically stable. -continue Plavix, heparin drip, Statin, not on B-blocker due to bradycardia, hypotension.  - appreciate Cards input.  Plan for Cardiac Cath tomorrow.   2. Tobacco abuse - advised pt. To quit  Smoking.   3. GERD - cont. Protonix  All the records are reviewed and case discussed with Care Management/Social Worker. Management plans discussed with the patient, family and they are in agreement.  CODE STATUS: Full code  DVT Prophylaxis: Hep. gtt  TOTAL TIME TAKING CARE OF THIS PATIENT: 30 minutes.   POSSIBLE D/C IN 1-2 DAYS, DEPENDING ON CLINICAL CONDITION.   Houston Siren M.D on 04/19/2017 at 11:55 AM  Between 7am to 6pm - Pager - [phone]  After 6pm go to [email or web address] - Social research officer, government  [facility] [place] [facility]  Office  [phone]  CC: Primary care physician; Patient, No Pcp Per

## 2017-04-19 NOTE — Progress Notes (Signed)
Pt complained of being hot. Took a set of vitals BP 100/41 P 56 Temp 98.6.  Turned down the ac and took some covers off of him. Will continue to monitor.

## 2017-04-20 ENCOUNTER — Encounter
Admission: EM | Disposition: A | Discharge status: Home / Self Care | Payer: Self-pay | Source: Home / Self Care | Attending: Specialist

## 2017-04-20 ENCOUNTER — Encounter: Payer: Self-pay | Admitting: Internal Medicine

## 2017-04-20 HISTORY — PX: LEFT HEART CATH AND CORONARY ANGIOGRAPHY: CATH118249

## 2017-04-20 LAB — CBC
HEMATOCRIT: 41.2 % (ref 40.0–52.0)
Hemoglobin: 14.2 g/dL (ref 13.0–18.0)
MCH: 29.2 pg (ref 26.0–34.0)
MCHC: 34.5 g/dL (ref 32.0–36.0)
MCV: 84.8 fL (ref 80.0–100.0)
Platelets: 235 10*3/uL (ref 150–440)
RBC: 4.86 MIL/uL (ref 4.40–5.90)
RDW: 13.1 % (ref 11.5–14.5)
WBC: 6 10*3/uL (ref 3.8–10.6)

## 2017-04-20 LAB — ECHOCARDIOGRAM COMPLETE
Height: 67 in
WEIGHTICAEL: 3212.8 [oz_av]

## 2017-04-20 LAB — CARDIAC CATHETERIZATION: CATHEFQUANT: 55 %

## 2017-04-20 LAB — HEPARIN LEVEL (UNFRACTIONATED): HEPARIN UNFRACTIONATED: 0.44 [IU]/mL (ref 0.30–0.70)

## 2017-04-20 SURGERY — LEFT HEART CATH AND CORONARY ANGIOGRAPHY
Anesthesia: Moderate Sedation

## 2017-04-20 MED ORDER — FENTANYL CITRATE (PF) 100 MCG/2ML IJ SOLN
INTRAMUSCULAR | Status: DC | PRN
Start: 2017-04-20 — End: 2017-04-20
  Administered 2017-04-20: 25 ug via INTRAVENOUS

## 2017-04-20 MED ORDER — ONDANSETRON HCL 4 MG/2ML IJ SOLN
4.0000 mg | Freq: Four times a day (QID) | INTRAMUSCULAR | Status: DC | PRN
Start: 2017-04-20 — End: 2017-04-20

## 2017-04-20 MED ORDER — SODIUM CHLORIDE 0.9 % IV SOLN: 250.0000 mL | INTRAVENOUS | Status: DC | PRN

## 2017-04-20 MED ORDER — SODIUM CHLORIDE 0.9% FLUSH: 3.0000 mL | INTRAVENOUS | Status: DC | PRN

## 2017-04-20 MED ORDER — KETOROLAC TROMETHAMINE 30 MG/ML IJ SOLN: 30.0000 mg | Freq: Once | INTRAMUSCULAR | Status: DC

## 2017-04-20 MED ORDER — MELOXICAM 7.5 MG PO TABS: 15.0000 mg | ORAL_TABLET | Freq: Every day | ORAL | Status: DC

## 2017-04-20 MED ORDER — HEPARIN (PORCINE) IN NACL 2-0.9 UNIT/ML-% IJ SOLN
INTRAMUSCULAR | Status: AC
  Filled 2017-04-20: qty 1000

## 2017-04-20 MED ORDER — FENTANYL CITRATE (PF) 100 MCG/2ML IJ SOLN
INTRAMUSCULAR | Status: AC
  Filled 2017-04-20: qty 2

## 2017-04-20 MED ORDER — MIDAZOLAM HCL 2 MG/2ML IJ SOLN
INTRAMUSCULAR | Status: DC | PRN
Start: 2017-04-20 — End: 2017-04-20
  Administered 2017-04-20: 1 mg via INTRAVENOUS

## 2017-04-20 MED ORDER — SODIUM CHLORIDE 0.9% FLUSH: 3.0000 mL | Freq: Two times a day (BID) | INTRAVENOUS | Status: DC

## 2017-04-20 MED ORDER — MIDAZOLAM HCL 2 MG/2ML IJ SOLN
INTRAMUSCULAR | Status: AC
  Filled 2017-04-20: qty 2

## 2017-04-20 MED ORDER — MELOXICAM 15 MG PO TABS: 15.0000 mg | ORAL_TABLET | Freq: Every day | ORAL | 0 refills | Status: DC

## 2017-04-20 MED ORDER — SODIUM CHLORIDE 0.9 % WEIGHT BASED INFUSION: 1.0000 mL/kg/h | INTRAVENOUS | Status: AC

## 2017-04-20 MED ORDER — ACETAMINOPHEN 325 MG PO TABS: 650.0000 mg | ORAL_TABLET | ORAL | Status: DC | PRN

## 2017-04-20 MED ORDER — KETOROLAC TROMETHAMINE 60 MG/2ML IM SOLN
INTRAMUSCULAR | Status: AC
  Administered 2017-04-20: 30 mg
  Filled 2017-04-20: qty 2

## 2017-04-20 MED ORDER — LIDOCAINE HCL (PF) 1 % IJ SOLN
INTRAMUSCULAR | Status: AC
  Filled 2017-04-20: qty 30

## 2017-04-20 MED ORDER — IOPAMIDOL (ISOVUE-300) INJECTION 61%
INTRAVENOUS | Status: DC | PRN
  Administered 2017-04-20: 70 mL via INTRA_ARTERIAL

## 2017-04-20 SURGICAL SUPPLY — 9 items
CATH 5FR JR4 DIAGNOSTIC (CATHETERS) ×3 IMPLANT
CATH 5FR PIGTAIL DIAGNOSTIC (CATHETERS) ×3 IMPLANT
CATH INFINITI 5FR JL4 (CATHETERS) ×3 IMPLANT
DEVICE CLOSURE MYNXGRIP 5F (Vascular Products) ×3 IMPLANT
KIT MANI 3VAL PERCEP (MISCELLANEOUS) ×3 IMPLANT
NEEDLE PERC 18GX7CM (NEEDLE) ×3 IMPLANT
PACK CARDIAC CATH (CUSTOM PROCEDURE TRAY) ×3 IMPLANT
SHEATH AVANTI 5FR X 11CM (SHEATH) ×3 IMPLANT
WIRE EMERALD 3MM-J .035X150CM (WIRE) ×3 IMPLANT

## 2017-04-20 NOTE — Progress Notes (Signed)
Pt returns from cardiac cath, right groin clean, dry and intact. Pts pulses are positive bilateral, no complaints of pain, NSR on telemetry. I will continue to assess.

## 2017-04-20 NOTE — Progress Notes (Signed)
Subjective:  Patient states pain isn't well denies any significant chest pain no shortness of breath  Objective:  Vital Signs in the last 24 hours: Temp:  [98.1 F (36.7 C)-98.7 F (37.1 C)] 98.2 F (36.8 C) (09/17 0718) Pulse Rate:  [53-78] 62 (09/17 0718) Resp:  [14-18] 17 (09/17 0718) BP: (100-139)/(41-82) 131/78 (09/17 0718) SpO2:  [97 %-100 %] 100 % (09/17 0739) Weight:  [201 lb (91.2 kg)-201 lb 11.2 oz (91.5 kg)] 201 lb (91.2 kg) (09/17 0718)  Intake/Output from previous day: 09/16 0701 - 09/17 0700 In: 2337.1 [P.O.:240; I.V.:2097.1] Out: 3750 [Urine:3750] Intake/Output from this shift: No intake/output data recorded.  Physical Exam: General appearance: appears stated age Neck: no adenopathy, no carotid bruit, no JVD, supple, symmetrical, trachea midline and thyroid not enlarged, symmetric, no tenderness/mass/nodules Lungs: clear to auscultation bilaterally Heart: regular rate and rhythm, S1, S2 normal, no murmur, click, rub or gallop Abdomen: soft, non-tender; bowel sounds normal; no masses,  no organomegaly Extremities: extremities normal, atraumatic, no cyanosis or edema Pulses: 2+ and symmetric Skin: Skin color, texture, turgor normal. No rashes or lesions Neurologic: Alert and oriented X 3, normal strength and tone. Normal symmetric reflexes. Normal coordination and gait  Lab Results:  Recent Labs  04/19/17 0452 04/20/17 0506  WBC 6.3 6.0  HGB 13.7 14.2  PLT 228 235    Recent Labs  04/18/17 1338 04/19/17 0452  NA 136 137  K 3.6 3.5  CL 101 105  CO2 29 26  GLUCOSE 100* 115*  BUN 7 9  CREATININE 0.96 0.90    Recent Labs  04/18/17 2351 04/19/17 0452  TROPONINI 6.20* 5.46*   Hepatic Function Panel  Recent Labs  04/19/17 0452  PROT 7.1  ALBUMIN 3.7  AST 64*  ALT 56  ALKPHOS 68  BILITOT 0.6   No results for input(s): CHOL in the last 72 hours. No results for input(s): PROTIME in the last 72 hours.  Imaging: Imaging results have been  reviewed  Cardiac Studies:  Assessment/Plan:  Elevated troponins  Non-STEMI Hypertension Smoking . Plan Agree with telemetry Continue IV anticoagulation with heparin Continue Plavix since patient was transferred Echocardiogram will be helpful for assessment of left ventricular function Recommend invasive cardiac catheter prior to discharge  LOS: 2 days    Morrill Bomkamp D Araeya Lamb 04/20/2017, 8:31 AM

## 2017-04-20 NOTE — Discharge Summary (Signed)
[facility] - [place] at [facility]   PATIENT NAME: James Mcconnell    MR#:  [id number]  DATE OF BIRTH:  [birth year]  DATE OF ADMISSION:  04/18/2017 ADMITTING PHYSICIAN: Marguarite Arbour, MD  DATE OF DISCHARGE: 04/20/2017  PRIMARY CARE PHYSICIAN: Beverely Risen   ADMISSION DIAGNOSIS:  ACS (acute coronary syndrome) (HCC) [I24.9]  DISCHARGE DIAGNOSIS:  Myocardial infarction ruled out Chest pain unspecified Elevated troponin  SECONDARY DIAGNOSIS:  History reviewed. No pertinent past medical history.  HOSPITAL COURSE:   1.  Patient admitted to the hospital with chest pain and found to have an elevated troponin.  Cardiac catheterization done on 04/20/2017 was negative.  NSTEMI ruled out. Patient felt better after Toradol was given. I will prescribe Mobic to go home with on a daily basis. Follow-up with cardiology as outpatient in 1 week. Note for workout for 1 week. Follow-up with PMD.  Stop cardiac meds since chest pain not secondary to the heart.  Could be inflammatory in nature versus a mild pericarditis.  DISCHARGE CONDITIONS:   satisfactory  CONSULTS OBTAINED:  cardiology  DRUG ALLERGIES:   Allergies  Allergen Reactions  . Aspirin Hives    Facial swelling   . Other Itching and Swelling    Pt reports allergy to chives. Pt reports eye swelling.    DISCHARGE MEDICATIONS:   Current Discharge Medication List    START taking these medications   Details  meloxicam (MOBIC) 15 MG tablet Take 1 tablet (15 mg total) by mouth daily. Qty: 30 tablet, Refills: 0      CONTINUE these medications which have NOT CHANGED   Details  acetaminophen (TYLENOL) 325 MG tablet Take 650 mg by mouth every 6 (six) hours as needed.      STOP taking these medications     ibuprofen (ADVIL,MOTRIN) 100 MG tablet      naproxen sodium (ANAPROX) 220 MG tablet          DISCHARGE INSTRUCTIONS:   Follow-up PMD 2 weeks Follow-up cardiology 1 week  If you experience worsening  of your admission symptoms, develop shortness of breath, life threatening emergency, suicidal or homicidal thoughts you must seek medical attention immediately by calling 911 or calling your MD immediately  if symptoms less severe.  You Must read complete instructions/literature along with all the possible adverse reactions/side effects for all the Medicines you take and that have been prescribed to you. Take any new Medicines after you have completely understood and accept all the possible adverse reactions/side effects.   Please note  You were cared for by a hospitalist during your hospital stay. If you have any questions about your discharge medications or the care you received while you were in the hospital after you are discharged, you can call the unit and asked to speak with the hospitalist on call if the hospitalist that took care of you is not available. Once you are discharged, your primary care physician will handle any further medical issues. Please note that NO REFILLS for any discharge medications will be authorized once you are discharged, as it is imperative that you return to your primary care physician (or establish a relationship with a primary care physician if you do not have one) for your aftercare needs so that they can reassess your need for medications and monitor your lab values.    Today   CHIEF COMPLAINT:  Chest pain  HISTORY OF PRESENT ILLNESS:  James Mcconnell  is a 31 y.o. male presented with chest pain  VITAL SIGNS:  Blood pressure 133/78, pulse 62, temperature 98.5 F (36.9 C), temperature source Oral, resp. rate 15, height  (1.702 m), weight 91.2 kg (201 lb), SpO2 100 %.    PHYSICAL EXAMINATION:  GENERAL:  31 y.o.-year-old patient lying in the bed with no acute distress.  EYES: Pupils equal, round, reactive to light and accommodation. No scleral icterus. Extraocular muscles intact.  HEENT: Head atraumatic, normocephalic. Oropharynx and nasopharynx  clear.  NECK:  Supple, no jugular venous distention. No thyroid enlargement, no tenderness.  LUNGS: Normal breath sounds bilaterally, no wheezing, rales,rhonchi or crepitation. No use of accessory muscles of respiration.  CARDIOVASCULAR: S1, S2 normal. No murmurs, rubs, or gallops.  ABDOMEN: Soft, non-tender, non-distended. Bowel sounds present. No organomegaly or mass.  EXTREMITIES: No pedal edema, cyanosis, or clubbing.  NEUROLOGIC: Cranial nerves II through XII are intact. Muscle strength 5/5 in all extremities. Sensation intact. Gait not checked.  PSYCHIATRIC: The patient is alert and oriented x 3.  SKIN: No obvious rash, lesion, or ulcer.   DATA REVIEW:   CBC  Recent Labs Lab 04/20/17 0506  WBC 6.0  HGB 14.2  HCT 41.2  PLT 235    Chemistries   Recent Labs Lab 04/19/17 0452  NA 137  K 3.5  CL 105  CO2 26  GLUCOSE 115*  BUN 9  CREATININE 0.90  CALCIUM 8.5*  AST 64*  ALT 56  ALKPHOS 68  BILITOT 0.6    Cardiac Enzymes  Recent Labs Lab 04/19/17 0452  TROPONINI 5.46*    Microbiology Results  Results for orders placed or performed during the hospital encounter of 05/23/15  Urine culture     Status: None   Collection Time: 05/23/15  5:27 PM  Result Value Ref Range Status   Specimen Description URINE, CLEAN CATCH  Final   Special Requests NONE  Final   Culture INSIGNIFICANT GROWTH  Final   Report Status 05/25/2015 FINAL  Final  Chlamydia/NGC rt PCR ([facility] only)     Status: None   Collection Time: 05/23/15  6:09 PM  Result Value Ref Range Status   Specimen source GC/Chlam URINE, RANDOM  Final   Chlamydia Tr NOT DETECTED NOT DETECTED Final   N gonorrhoeae NOT DETECTED NOT DETECTED Final    Comment: (NOTE) 100  This methodology has not been evaluated in pregnant women or in 200  patients with a history of hysterectomy. 300 400  This methodology will not be performed on patients less than 51  years of age.        Management plans discussed with the  patient, family and they are in agreement.  CODE STATUS:     Code Status Orders        Start     Ordered   04/18/17 1746  Full code  Continuous     04/18/17 1745    Code Status History    Date Active Date Inactive Code Status Order ID Comments User Context   This patient has a current code status but no historical code status.      TOTAL TIME TAKING CARE OF THIS PATIENT: 35 minutes.    Alford Highland M.D on 04/20/2017 at 3:37 PM  Between 7am to 6pm - Pager - [phone]  After 6pm go to [email or web address] - password [email or web address]  [employer] [facility] Office  [phone]  CC: Primary care physician; Beverely Risen

## 2017-04-20 NOTE — Plan of Care (Signed)
Problem: Pain Managment: Goal: General experience of comfort will improve Outcome: Progressing RN will assess pain every four hours and treat with medication as needed.

## 2017-04-20 NOTE — Progress Notes (Signed)
Patient ID: James Mcconnell, male   DOB: July 27, 1986, 31 y.o.   MRN: [id number] [facility] - [place] at [facility] was admitted to the Hospital on 04/18/2017 and Discharged  04/20/2017 and should be excused from work/school   for 9  days starting 04/18/2017 , may return to work/school without any restrictions.  Alford Highland M.D on 04/20/2017,at 10:28 AM  [employer] [facility] - [place] at [facility]  [phone]

## 2017-04-20 NOTE — Progress Notes (Signed)
A&O. Up with standby assist. For cath today. Heparin drip and NS infusing.

## 2017-04-20 NOTE — Progress Notes (Signed)
ANTICOAGULATION CONSULT NOTE - Initial Consult  Pharmacy Consult for Heparin Drip  Indication: chest pain/ACS  Allergies  Allergen Reactions  . Aspirin Hives    Facial swelling   . Other Itching and Swelling    Pt reports allergy to chives. Pt reports eye swelling.    Patient Measurements: Height:  (170.2 cm) Weight: 201 lb 11.2 oz (91.5 kg) IBW/kg (Calculated) : 66.1 Heparin Dosing Weight: 82 kg  Vital Signs: Temp: 98.2 F (36.8 C) (09/17 0526) Temp Source: Oral (09/17 0526) BP: 130/73 (09/17 0526) Pulse Rate: 57 (09/17 0526)  Labs:  Recent Labs  04/18/17 1338 04/18/17 1439 04/18/17 1804  04/18/17 2351 04/19/17 0452 04/19/17 1056 04/20/17 0506  HGB 14.8  --   --   --   --  13.7  --  14.2  HCT 42.9  --   --   --   --  39.5*  --  41.2  PLT 245  --   --   --   --  228  --  235  APTT  --  32  --   --   --   --   --   --   LABPROT  --  13.2  --   --   --   --   --   --   INR  --  1.01  --   --   --   --   --   --   HEPARINUNFRC  --   --   --   < >  --  0.35 0.37 0.44  CREATININE 0.96  --   --   --   --  0.90  --   --   TROPONINI 6.16*  --  6.47*  --  6.20* 5.46*  --   --   < > = values in this interval not displayed.  Estimated Creatinine Clearance: 129.5 mL/min (by C-G formula based on SCr of 0.9 mg/dL).   Medical History: History reviewed. No pertinent past medical history.  Assessment: Pharmacy consulted for heparin drip dosing and monitoring for 31 yo patient with ACS/Chest Pain.  Troponin I: 6.16   Goal of Therapy:  Heparin level 0.3-0.7 units/ml Monitor platelets by anticoagulation protocol: Yes   Plan:  Baseline labs orders  Give 4000 units bolus x 1 Start heparin infusion at 950 units/hr Check anti-Xa level in 6 hours and daily while on heparin Continue to monitor H&H and platelets   09/15 2046 HL 0.30. Level is just barely therapeutic. Will increase infusion to heparin 1000units/hr and recheck HL in 6 hours.   9/16 @ 0500 HL 0.35  therapeutic, but considering patient has trops of 6.0 will increase rate to 1100 units/hr and will recheck HL @ 1100.  9/16: Heparin level within goal range again. Will recheck Heparin level with am labs.   9/17 @ 0500 HL 0.44 therapeutic. Will continue currant rate and recheck HL w/ am labs.  Thomasene Ripple, PharmD, BCPS Clinical Pharmacist 04/20/2017 6:19 AM

## 2017-04-21 LAB — HIV ANTIBODY (ROUTINE TESTING W REFLEX): HIV Screen 4th Generation wRfx: NONREACTIVE

## 2017-04-23 NOTE — Progress Notes (Signed)
Subjective:  Still doing reasonably well denies any worsening chest pain no bleeding on anticoagulation no shortness of breath  Objective:  Vital Signs in the last 24 hours:    Intake/Output from previous day: No intake/output data recorded. Intake/Output from this shift: No intake/output data recorded.  Physical Exam: General appearance: appears stated age Neck: no adenopathy, no carotid bruit, no JVD, supple, symmetrical, trachea midline and thyroid not enlarged, symmetric, no tenderness/mass/nodules Lungs: clear to auscultation bilaterally Heart: regular rate and rhythm, S1, S2 normal, no murmur, click, rub or gallop Abdomen: soft, non-tender; bowel sounds normal; no masses,  no organomegaly Extremities: extremities normal, atraumatic, no cyanosis or edema Pulses: 2+ and symmetric Skin: Skin color, texture, turgor normal. No rashes or lesions Neurologic: Alert and oriented X 3, normal strength and tone. Normal symmetric reflexes. Normal coordination and gait  Lab Results: No results for input(s): WBC, HGB, PLT in the last 72 hours. No results for input(s): NA, K, CL, CO2, GLUCOSE, BUN, CREATININE in the last 72 hours. No results for input(s): TROPONINI in the last 72 hours.  Invalid input(s): CK, MB Hepatic Function Panel No results for input(s): PROT, ALBUMIN, AST, ALT, ALKPHOS, BILITOT, BILIDIR, IBILI in the last 72 hours. No results for input(s): CHOL in the last 72 hours. No results for input(s): PROTIME in the last 72 hours.  Imaging: Imaging results have been reviewed  Cardiac Studies:  Assessment/Plan:  non-STEMI  Hypertension Elevated troponins Pre-op cardiac catheter.  Plan Continue telemetry Agree with IV heparin therapy for anticoagulation Continue aspirin therapy Recommend cardiac catheter in the morning    LOS: 2 days    James Mcconnell 04/23/2017, 1:12 PM

## 2017-04-27 ENCOUNTER — Other Ambulatory Visit: Payer: Self-pay | Admitting: Internal Medicine

## 2017-04-27 DIAGNOSIS — I729 Aneurysm of unspecified site: Secondary | ICD-10-CM

## 2017-04-28 ENCOUNTER — Ambulatory Visit
Admission: RE | Admit: 2017-04-28 | Discharge: 2017-04-28 | Disposition: A | Discharge status: Home / Self Care | Payer: BLUE CROSS/BLUE SHIELD | Source: Ambulatory Visit | Attending: Internal Medicine | Admitting: Internal Medicine

## 2017-04-28 DIAGNOSIS — I729 Aneurysm of unspecified site: Secondary | ICD-10-CM

## 2017-04-28 DIAGNOSIS — R52 Pain, unspecified: Secondary | ICD-10-CM | POA: Insufficient documentation

## 2018-09-08 IMAGING — DX DG CHEST 1V PORT
1 series · 1 of 1 positions shown · non-contrast
Comparison: None.

CLINICAL DATA: Radiating chest pain.  Current smoker.

EXAM:
PORTABLE CHEST 1 VIEW

[chest ap]
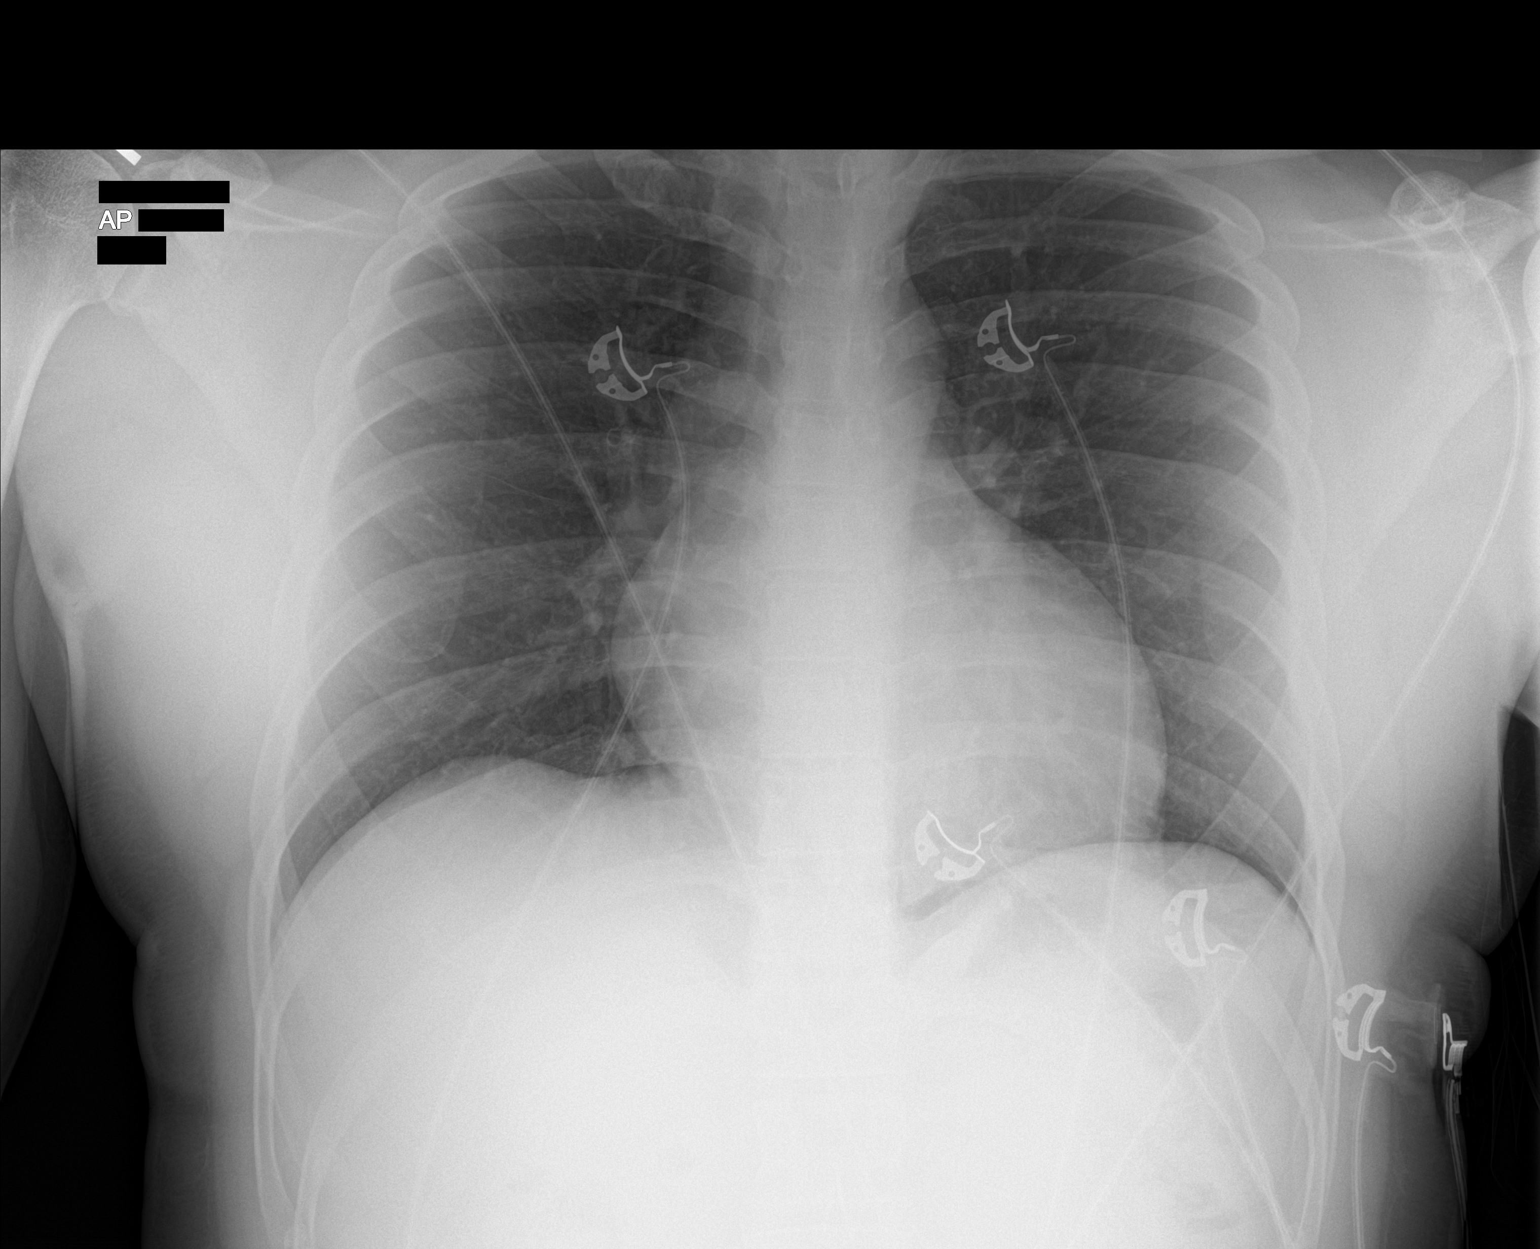

[1 of 1 positions shown; findings below may reference images not displayed]

FINDINGS: Normal cardiac silhouette and mediastinal contours given AP
projection. No focal airspace opacities. No pleural effusion or
pneumothorax. No evidence of edema. No acute osseus abnormalities.
IMPRESSION: No acute cardiopulmonary disease on this AP portable examination.
Further evaluation with a PA and lateral chest radiograph may be
obtained as clinically indicated.

## 2019-03-01 ENCOUNTER — Encounter: Payer: Self-pay | Admitting: Emergency Medicine

## 2019-03-01 ENCOUNTER — Ambulatory Visit
Admission: EM | Admit: 2019-03-01 | Discharge: 2019-03-01 | Disposition: A | Discharge status: Home / Self Care | Payer: BC Managed Care – PPO

## 2019-03-01 ENCOUNTER — Other Ambulatory Visit: Payer: Self-pay

## 2019-03-01 DIAGNOSIS — Z711 Person with feared health complaint in whom no diagnosis is made: Secondary | ICD-10-CM

## 2019-03-01 NOTE — ED Provider Notes (Signed)
[place], [place]   Name: James Mcconnell DOB: 11/10/1985 MRN: [id number] CSN: [id number] PCP: Patient, No Pcp Per  Arrival date and time:  03/01/19 0932  Chief Complaint:  SEXUALLY TRANSMITTED DISEASE   NOTE: Prior to seeing the patient today, I have reviewed the triage nursing documentation and vital signs. Clinical staff has updated patient's PMH/PSHx, current medication list, and drug allergies/intolerances to ensure comprehensive history available to assist in medical decision making.   History:   HPI: James Mcconnell is a 33 y.o. male who presents today with concerns related to possible STI. Patient advising that he and his wife separated x 5 years ago. During their separation, patient endorses unprotected sexual activity with another male partner. Patient and his wife are now back together. During his wife's routine pelvic exam and pap smear, she was diagnosed with HPV yesterday. Patient presents today with concerns that he gave the virus to his wife. He denies any associated symptoms; no penile lesions, pains, swelling, or discharge. Patient has not experienced any urinary symptoms; dysuria, frequency, urgency, back pain, abdominal pain, or gross hematuria. Patient states, "I am basically here to find out if I am the one that gave it to my wife".   History reviewed. No pertinent past medical history.  Past Surgical History:  Procedure Laterality Date  . LEFT HEART CATH AND CORONARY ANGIOGRAPHY N/A 04/20/2017   Procedure: LEFT HEART CATH AND CORONARY ANGIOGRAPHY and pci;  Surgeon: Alwyn Peaallwood, Dwayne D, MD;  Location: [facility] INVASIVE CV LAB;  Service: Cardiovascular;  Laterality: N/A;  . NO PAST SURGERIES      No family history on file.  Social History   Tobacco Use  . Smoking status: Current Every Day Smoker    Packs/day: 0.50  . Smokeless tobacco: Never Used  Substance Use Topics  . Alcohol use: Not Currently    Comment: occasional  . Drug use: No    Patient Active Problem List   Diagnosis Date Noted  . NSTEMI (non-ST elevated myocardial infarction) (HCC) 04/18/2017  . Abnormal EKG 04/18/2017  . Chest pain 04/18/2017  . HTN (hypertension) 04/18/2017    Home Medications:    No outpatient medications have been marked as taking for the 03/01/19 encounter [facility](Hospital Encounter).    Allergies:   Aspirin and Other  Review of Systems (ROS): Review of Systems  Constitutional: Negative for fatigue and fever.  HENT: Negative for congestion, ear pain, postnasal drip, rhinorrhea, sinus pressure, sinus pain, sneezing and sore throat.   Eyes: Negative for pain, discharge and redness.  Respiratory: Negative for cough, chest tightness and shortness of breath.   Cardiovascular: Negative for chest pain and palpitations.  Gastrointestinal: Negative for abdominal pain, diarrhea, nausea and vomiting.  Genitourinary: Negative for discharge, dysuria, flank pain, frequency, genital sores, hematuria, penile pain, penile swelling, scrotal swelling, testicular pain and urgency.  Musculoskeletal: Negative for arthralgias, back pain, myalgias and neck pain.  Skin: Negative for color change, pallor and rash.  Neurological: Negative for dizziness, syncope, weakness and headaches.  Hematological: Negative for adenopathy.     Vital Signs: Today's Vitals   03/01/19 0950 03/01/19 0951  BP: (!) 147/84   Pulse: 71   Resp: 16   Temp: 98.6 F (37 C)   TempSrc: Oral   SpO2: 100%   Weight:  205 lb (93 kg)  Height:  5\' 6"  (1.676 m)  PainSc:  0-No pain    Physical Exam: Physical Exam  Constitutional: He is oriented to person, place, and time and well-developed, well-nourished,  and in no distress.  HENT:  Head: Normocephalic and atraumatic.  Mouth/Throat: Mucous membranes are normal.  Eyes: Pupils are equal, round, and reactive to light. EOM are normal.  Cardiovascular: Normal rate and intact distal pulses.  Pulmonary/Chest: Effort normal. No respiratory distress.  Abdominal: Soft. He  exhibits no distension. There is no abdominal tenderness.  Neurological: He is alert and oriented to person, place, and time. Gait normal. GCS score is 15.  Skin: Skin is warm and dry. No rash noted.  Psychiatric: Mood, memory, affect and judgment normal.  Nursing note and vitals reviewed.   Urgent Care Treatments / Results:   LABS: PLEASE NOTE: all labs that were ordered this encounter are listed, however only abnormal results are displayed. Labs Reviewed - No data to display  EKG: -None  RADIOLOGY: No results found.  PROCEDURES: Procedures  MEDICATIONS RECEIVED THIS VISIT: Medications - No data to display  PERTINENT CLINICAL COURSE NOTES/UPDATES:   Initial Impression / Assessment and Plan / Urgent Care Course:  Pertinent labs & imaging results that were available during my care of the patient were personally reviewed by me and considered in my medical decision making (see lab/imaging section of note for values and interpretations).  James Mcconnell is a 33 y.o. male who presents to [facility] today with complaints of SEXUALLY TRANSMITTED DISEASE   Patient is well appearing overall in clinic today. He does not appear to be in any acute distress. Presenting symptoms (see HPI) and exam as documented above. Patient has no current symptoms at the time of his visit today. We spent time discussing the symptoms of HPV. The CDC does not recommended routine screening for this STI in the absence of penile lesions and associated symptoms. Patient provided with written information on today's AVS on HPV. Discussed safe sexual practices. He was advised that if any symptoms or penile lesions declared, he would need to be seen by the [facility] Department for further evaluation and management.   I have reviewed the follow up and strict return precautions for any new or worsening symptoms. Patient is aware of symptoms that would be deemed urgent/emergent, and would thus require  further evaluation either here or in the emergency department. At the time of discharge, he verbalized understanding and consent with the discharge plan as it was reviewed with him. All questions were fielded by provider and/or clinic staff prior to patient discharge.    Final Clinical Impressions / Urgent Care Diagnoses:   Final diagnoses:  Concern about STD in male without diagnosis    New Prescriptions:  [place] Controlled Substance Registry consulted? Not Applicable  No orders of the defined types were placed in this encounter.   Recommended Follow up Care:  Patient encouraged to follow up with the following provider within the specified time frame, or sooner as dictated by the severity of his symptoms. As always, he was instructed that for any urgent/emergent care needs, he should seek care either here or in the emergency department for more immediate evaluation.  Follow-up Information    [facility].   Why: If any lesions develop, you will need to go to health department for further management. Contact information: [place]        NOTE: This note was prepared using Lobbyist along with smaller phrase technology. Despite my best ability to proofread, there is the potential that transcriptional errors may still occur from this process,  and are completely unintentional.     Verlee MonteGray, Carel Schnee E, NP 03/01/19 1053

## 2019-03-01 NOTE — Discharge Instructions (Signed)
It was very nice seeing you today in clinic. Thank you for entrusting me with your care.   You stated that you had no lesions or other symptoms at this time. There is no recommendation for routine screening for HPV in men. I have given you some information to read through today, but this DOES NOT mean that you have the virus, or that you passed it to your wife. If you develop lesions, you will need to follow up with the health department for definitive management.   Again, it was my pleasure to take care of you today. Thank you for choosing our clinic. I hope that you start to feel better quickly.   Honor Loh, MSN, APRN, FNP-C, CEN Advanced Practice Provider [facility]

## 2019-03-01 NOTE — ED Triage Notes (Signed)
Pt reports him and wife separated a few years ago, recently got back together. Wife went to OBGYN yesterday and wife was told she has HPV. Pt worried he may have given it to her and wants to be check. Denies any discharge or urinary symptoms.

## 2022-09-18 DIAGNOSIS — J019 Acute sinusitis, unspecified: Secondary | ICD-10-CM | POA: Diagnosis not present

## 2022-09-18 DIAGNOSIS — J209 Acute bronchitis, unspecified: Secondary | ICD-10-CM | POA: Diagnosis not present

## 2022-09-18 DIAGNOSIS — U071 COVID-19: Secondary | ICD-10-CM | POA: Diagnosis not present

## 2022-09-18 DIAGNOSIS — Z03818 Encounter for observation for suspected exposure to other biological agents ruled out: Secondary | ICD-10-CM | POA: Diagnosis not present

## 2023-01-05 ENCOUNTER — Other Ambulatory Visit (HOSPITAL_BASED_OUTPATIENT_CLINIC_OR_DEPARTMENT_OTHER): Payer: Self-pay

## 2023-01-05 ENCOUNTER — Emergency Department (HOSPITAL_BASED_OUTPATIENT_CLINIC_OR_DEPARTMENT_OTHER)
Admission: EM | Admit: 2023-01-05 | Discharge: 2023-01-05 | Disposition: A | Discharge status: Home / Self Care | Payer: BC Managed Care – PPO | Attending: Emergency Medicine | Admitting: Emergency Medicine

## 2023-01-05 ENCOUNTER — Ambulatory Visit
Admission: EM | Admit: 2023-01-05 | Discharge: 2023-01-05 | Discharge status: Against Medical Advice | Payer: BC Managed Care – PPO

## 2023-01-05 DIAGNOSIS — I889 Nonspecific lymphadenitis, unspecified: Secondary | ICD-10-CM | POA: Insufficient documentation

## 2023-01-05 DIAGNOSIS — L049 Acute lymphadenitis, unspecified: Secondary | ICD-10-CM | POA: Diagnosis not present

## 2023-01-05 DIAGNOSIS — R221 Localized swelling, mass and lump, neck: Secondary | ICD-10-CM | POA: Diagnosis not present

## 2023-01-05 LAB — GROUP A STREP BY PCR: Group A Strep by PCR: NOT DETECTED

## 2023-01-05 MED ORDER — KETOROLAC TROMETHAMINE 15 MG/ML IJ SOLN
15.0000 mg | Freq: Once | INTRAMUSCULAR | Status: AC
  Administered 2023-01-05: 15 mg via INTRAMUSCULAR
  Filled 2023-01-05: qty 1

## 2023-01-05 MED ORDER — ACETAMINOPHEN 500 MG PO TABS
1000.0000 mg | ORAL_TABLET | Freq: Once | ORAL | Status: AC
  Administered 2023-01-05: 1000 mg via ORAL
  Filled 2023-01-05: qty 2

## 2023-01-05 MED ORDER — AMOXICILLIN-POT CLAVULANATE 875-125 MG PO TABS
1.0000 | ORAL_TABLET | Freq: Two times a day (BID) | ORAL | 0 refills | Status: DC
  Filled 2023-01-05: qty 14, 7d supply, fill #0

## 2023-01-05 NOTE — ED Provider Notes (Signed)
[facility] Provider Note   CSN: [id number] Arrival date & time: 01/05/23  1503     History Chief Complaint  Patient presents with   Neck Pain    HPI James Mcconnell is a 37 y.o. male presenting for chief complaint of neck swelling.  37 year old male minimal medical history.  States that he has a tender palpable spot in his left anterior neck.  It swelled up earlier in the week but had gone away and then came back today with severe tenderness to palpation.  States it hurts when he swallows but he is able to tolerate liquids.  Denies any sore throat dental problems or jaw involvement. Denies any posterior auricular swelling.  Denies fevers chills nausea vomiting syncope shortness of breath..   Patient's recorded medical, surgical, social, medication list and allergies were reviewed in the Snapshot window as part of the initial history.   Review of Systems   Review of Systems  Constitutional:  Negative for chills and fever.  HENT:  Negative for ear pain and sore throat.   Eyes:  Negative for pain and visual disturbance.  Respiratory:  Negative for cough and shortness of breath.   Cardiovascular:  Negative for chest pain and palpitations.  Gastrointestinal:  Negative for abdominal pain and vomiting.  Genitourinary:  Negative for dysuria and hematuria.  Musculoskeletal:  Negative for arthralgias and back pain.  Skin:  Negative for color change and rash.  Neurological:  Negative for seizures and syncope.  All other systems reviewed and are negative.   Physical Exam Updated Vital Signs BP (!) 136/97 (BP Location: Left Arm)   Pulse 66   Temp 98.3 F (36.8 C) (Oral)   Resp 18   Ht 5\' 7"  (1.702 m)   Wt 93.4 kg   SpO2 100%   BMI 32.26 kg/m  Physical Exam Vitals and nursing note reviewed.  Constitutional:      General: He is not in acute distress.    Appearance: He is well-developed.  HENT:     Head: Normocephalic and atraumatic.   Eyes:     Conjunctiva/sclera: Conjunctivae normal.  Cardiovascular:     Rate and Rhythm: Normal rate and regular rhythm.     Heart sounds: No murmur heard. Pulmonary:     Effort: Pulmonary effort is normal. No respiratory distress.     Breath sounds: Normal breath sounds.  Abdominal:     Palpations: Abdomen is soft.     Tenderness: There is no abdominal tenderness.  Musculoskeletal:        General: Swelling (Localized approximate 1 cm swelling identified on his left anterior throat.  Very tender to palpation.) present.     Cervical back: Neck supple.  Skin:    General: Skin is warm and dry.     Capillary Refill: Capillary refill takes less than 2 seconds.  Neurological:     Mental Status: He is alert.  Psychiatric:        Mood and Affect: Mood normal.      ED Course/ Medical Decision Making/ A&P    Procedures Procedures   Medications Ordered in ED Medications  acetaminophen (TYLENOL) tablet 1,000 mg (has no administration in time range)  ketorolac (TORADOL) 15 MG/ML injection 15 mg (has no administration in time range)    Medical Decision Making:    James Mcconnell is a 37 y.o. male who presented to the ED today with soft tissue swelling detailed above.     Complete initial  physical exam performed, notably the patient  was HDS in NAD.      Reviewed and confirmed nursing documentation for past medical history, family history, social history.    Initial Assessment:   Patient's history present on his physical exam findings are most consistent with likely bacterial lymphadenitis.  Point-of-care ultrasound was performed as above that demonstrates a 1 cm lymph node with blood flow without abscess.  He is very tender at this location.  Given the bimodal nature of patient's description of his illness, I do believe he would benefit from bacterial coverage. Disposition:  I have considered need for hospitalization, however, considering all of the above, I believe this patient is  stable for discharge at this time.  Patient/family educated about specific return precautions for given chief complaint and symptoms.  Patient/family educated about follow-up with PCP.     Patient/family expressed understanding of return precautions and need for follow-up. Patient spoken to regarding all imaging and laboratory results and appropriate follow up for these results. All education provided in verbal form with additional information in written form. Time was allowed for answering of patient questions. Patient discharged.    Emergency Department Medication Summary:   Medications  acetaminophen (TYLENOL) tablet 1,000 mg (has no administration in time range)  ketorolac (TORADOL) 15 MG/ML injection 15 mg (has no administration in time range)        Clinical Impression:  1. Lymphadenitis      Discharge   Final Clinical Impression(s) / ED Diagnoses Final diagnoses:  Lymphadenitis    Rx / DC Orders ED Discharge Orders          Ordered    amoxicillin-clavulanate (AUGMENTIN) 875-125 MG tablet  Every 12 hours        01/05/23 1546              Glyn Ade, MD 01/05/23 1547

## 2023-01-05 NOTE — ED Triage Notes (Signed)
Neck pain, lymph node swollen on left side.  Reports hard to swallow

## 2023-03-05 ENCOUNTER — Other Ambulatory Visit (HOSPITAL_BASED_OUTPATIENT_CLINIC_OR_DEPARTMENT_OTHER): Payer: Self-pay

## 2023-03-05 ENCOUNTER — Other Ambulatory Visit: Payer: Self-pay

## 2023-03-05 ENCOUNTER — Emergency Department (HOSPITAL_BASED_OUTPATIENT_CLINIC_OR_DEPARTMENT_OTHER)
Admission: EM | Admit: 2023-03-05 | Discharge: 2023-03-05 | Discharge status: Against Medical Advice | Payer: BC Managed Care – PPO | Attending: Emergency Medicine | Admitting: Emergency Medicine

## 2023-03-05 DIAGNOSIS — N4889 Other specified disorders of penis: Secondary | ICD-10-CM | POA: Insufficient documentation

## 2023-03-05 DIAGNOSIS — Z202 Contact with and (suspected) exposure to infections with a predominantly sexual mode of transmission: Secondary | ICD-10-CM | POA: Diagnosis not present

## 2023-03-05 DIAGNOSIS — Z5321 Procedure and treatment not carried out due to patient leaving prior to being seen by health care provider: Secondary | ICD-10-CM | POA: Insufficient documentation

## 2023-03-05 NOTE — ED Triage Notes (Signed)
Patient presents to ED via POV from home. Here after being told by his sexual partner that he has been exposed to trich. Here for STD testing and treatment. Reports penile itching. Denies discharge.

## 2023-06-22 ENCOUNTER — Ambulatory Visit
Admission: EM | Admit: 2023-06-22 | Discharge: 2023-06-22 | Disposition: A | Discharge status: Home / Self Care | Payer: BC Managed Care – PPO | Attending: Physician Assistant | Admitting: Physician Assistant

## 2023-06-22 DIAGNOSIS — L049 Acute lymphadenitis, unspecified: Secondary | ICD-10-CM | POA: Diagnosis not present

## 2023-06-22 LAB — GROUP A STREP BY PCR: Group A Strep by PCR: NOT DETECTED

## 2023-06-22 MED ORDER — AMOXICILLIN-POT CLAVULANATE 875-125 MG PO TABS: 1.0000 | ORAL_TABLET | Freq: Two times a day (BID) | ORAL | 0 refills | Status: AC

## 2023-06-22 NOTE — Discharge Instructions (Addendum)
-  Negative strep test. - I sent antibiotics to pharmacy.  Take full course. - You should be improving in the next 2 days.  Increase rest and fluids, Tylenol as needed for discomfort.  If you develop a fever or worsening symptoms go to the emergency room.

## 2023-06-22 NOTE — ED Triage Notes (Signed)
Pt c/o knot in L side of neck x1 wk. States hard to swallow.

## 2023-06-22 NOTE — ED Provider Notes (Signed)
[facility]    CSN: [id number] Arrival date & time: 06/22/23  1439      History   Chief Complaint Chief Complaint  Patient presents with   Knot    HPI James Mcconnell is a 37 y.o. male presenting for swelling of the lymph node of the left side of his neck.  Symptoms are present for a week.  He reports pain in the night when he swallows, coughs, leans forward.  Denies fever, cough, congestion, ear pain, headaches, neck pain, fatigue, weakness, shortness of breath, chest pain.  He was seen in the emergency department 5 months ago for bacterial lymphadenitis of the lymph node of the left side, similar to where he is having symptoms at this time.  He reports that he did not take all the antibiotics because he started to feel better.  He states the lymph node completely went away after taking Augmentin for a few days before.  HPI  History reviewed. No pertinent past medical history.  Patient Active Problem List   Diagnosis Date Noted   NSTEMI (non-ST elevated myocardial infarction) (HCC) 04/18/2017   Abnormal EKG 04/18/2017   Chest pain 04/18/2017   HTN (hypertension) 04/18/2017    Past Surgical History:  Procedure Laterality Date   LEFT HEART CATH AND CORONARY ANGIOGRAPHY N/A 04/20/2017   Procedure: LEFT HEART CATH AND CORONARY ANGIOGRAPHY and pci;  Surgeon: Alwyn Pea, MD;  Location: [facility] INVASIVE CV LAB;  Service: Cardiovascular;  Laterality: N/A;   NO PAST SURGERIES         Home Medications    Prior to Admission medications   Medication Sig Start Date End Date Taking? Authorizing Provider  amoxicillin-clavulanate (AUGMENTIN) 875-125 MG tablet Take 1 tablet by mouth every 12 (twelve) hours for 7 days. 06/22/23 06/29/23 Yes Eusebio Friendly B, PA-C  amoxicillin-clavulanate (AUGMENTIN) 875-125 MG tablet Take 1 tablet by mouth every 12 (twelve) hours for 7 days. 06/22/23 06/29/23  Shirlee Latch, PA-C    Family History History reviewed. No pertinent family  history.  Social History Social History   Tobacco Use   Smoking status: Every Day    Current packs/day: 0.50    Types: Cigarettes   Smokeless tobacco: Never  Vaping Use   Vaping status: Never Used  Substance Use Topics   Alcohol use: Not Currently    Comment: occasional   Drug use: No     Allergies   Aspirin and Other   Review of Systems Review of Systems  Constitutional:  Negative for appetite change, fatigue and fever.  HENT:  Negative for congestion, ear pain, rhinorrhea, sinus pain and sore throat.   Respiratory:  Negative for cough, shortness of breath and wheezing.   Cardiovascular:  Negative for chest pain.  Gastrointestinal:  Negative for nausea and vomiting.  Neurological:  Negative for dizziness, weakness and headaches.  Hematological:  Positive for adenopathy.     Physical Exam Triage Vital Signs ED Triage Vitals  Encounter Vitals Group     BP 06/22/23 1458 129/89     Systolic BP Percentile --      Diastolic BP Percentile --      Pulse Rate 06/22/23 1458 60     Resp 06/22/23 1458 16     Temp 06/22/23 1458 98.2 F (36.8 C)     Temp Source 06/22/23 1458 Oral     SpO2 06/22/23 1458 100 %     Weight 06/22/23 1457 210 lb (95.3 kg)     Height 06/22/23  1457 5\' 7"  (1.702 m)     Head Circumference --      Peak Flow --      Pain Score 06/22/23 1503 10     Pain Loc --      Pain Education --      Exclude from Growth Chart --    No data found.  Updated Vital Signs BP 129/89 (BP Location: Left Arm)   Pulse 60   Temp 98.2 F (36.8 C) (Oral)   Resp 16   Ht 5\' 7"  (1.702 m)   Wt 210 lb (95.3 kg)   SpO2 100%   BMI 32.89 kg/m    Physical Exam Vitals and nursing note reviewed.  Constitutional:      General: He is not in acute distress.    Appearance: Normal appearance. He is well-developed. He is not ill-appearing.  HENT:     Head: Normocephalic and atraumatic.     Right Ear: Tympanic membrane, ear canal and external ear normal.     Left Ear:  Tympanic membrane, ear canal and external ear normal.     Nose: Nose normal.     Mouth/Throat:     Mouth: Mucous membranes are moist.     Pharynx: Oropharynx is clear.  Eyes:     General: No scleral icterus.    Conjunctiva/sclera: Conjunctivae normal.  Neck:     Comments: No posterior cervical or supraclavicular lymph node swelling Cardiovascular:     Rate and Rhythm: Normal rate and regular rhythm.     Heart sounds: Normal heart sounds.  Pulmonary:     Effort: Pulmonary effort is normal. No respiratory distress.     Breath sounds: Normal breath sounds.  Musculoskeletal:     Cervical back: Neck supple.  Lymphadenopathy:     Cervical: Cervical adenopathy (left anterior) present.  Skin:    General: Skin is warm and dry.     Capillary Refill: Capillary refill takes less than 2 seconds.  Neurological:     General: No focal deficit present.     Mental Status: He is alert. Mental status is at baseline.     Motor: No weakness.     Gait: Gait normal.  Psychiatric:        Mood and Affect: Mood normal.        Behavior: Behavior normal.      UC Treatments / Results  Labs (all labs ordered are listed, but only abnormal results are displayed) Labs Reviewed  GROUP A STREP BY PCR    EKG   Radiology No results found.  Procedures Procedures (including critical care time)  Medications Ordered in UC Medications - No data to display  Initial Impression / Assessment and Plan / UC Course  I have reviewed the triage vital signs and the nursing notes.  Pertinent labs & imaging results that were available during my care of the patient were reviewed by me and considered in my medical decision making (see chart for details).   37 year old male presents for swollen lymph nodes of the left anterior neck for the past week.  Reports no associated URI symptoms or fevers.  Similar symptoms 5 months ago when he was seen in the emergency department.  He had an ultrasound which showed a  swollen lymph node and ER doctor was concerned for bacterial lymphadenitis.  He was given Augmentin and got better in a few days but did not finish all the antibiotics.  On exam he has swelling of the left anterior cervical  lymph nodes, otherwise normal exam.  No other areas of swollen lymph nodes.  Normal remainder of HEENT exam.  Chest clear.  PCR strep test obtained.  Negative.  This result with patient.  Will treat patient for possible bacterial lymphadenitis since he was treated for this in the ER and it helped.  Sent Augmentin.  Encouraged him to take full course of medication.  Reviewed following up with PCP if he is not feeling better.  ER precautions discussed.   Final Clinical Impressions(s) / UC Diagnoses   Final diagnoses:  Lymphadenitis, acute     Discharge Instructions      -Negative strep test. - I sent antibiotics to pharmacy.  Take full course. - You should be improving in the next 2 days.  Increase rest and fluids, Tylenol as needed for discomfort.  If you develop a fever or worsening symptoms go to the emergency room.     ED Prescriptions     Medication Sig Dispense Auth. Provider   amoxicillin-clavulanate (AUGMENTIN) 875-125 MG tablet Take 1 tablet by mouth every 12 (twelve) hours for 7 days. 14 tablet Eusebio Friendly B, PA-C   amoxicillin-clavulanate (AUGMENTIN) 875-125 MG tablet Take 1 tablet by mouth every 12 (twelve) hours for 7 days. 14 tablet Gareth Morgan      PDMP not reviewed this encounter.   Shirlee Latch, PA-C 06/22/23 [phone]

## 2023-06-29 ENCOUNTER — Ambulatory Visit: Payer: BC Managed Care – PPO | Admitting: Family Medicine

## 2023-09-21 ENCOUNTER — Ambulatory Visit (INDEPENDENT_AMBULATORY_CARE_PROVIDER_SITE_OTHER): Payer: BC Managed Care – PPO | Admitting: Family Medicine

## 2023-09-21 VITALS — BP 124/78 | HR 76 | Temp 98.4°F | Resp 18 | Ht 67.0 in | Wt 211.0 lb

## 2023-09-21 DIAGNOSIS — R5383 Other fatigue: Secondary | ICD-10-CM

## 2023-09-21 DIAGNOSIS — Z114 Encounter for screening for human immunodeficiency virus [HIV]: Secondary | ICD-10-CM

## 2023-09-21 DIAGNOSIS — Z23 Encounter for immunization: Secondary | ICD-10-CM

## 2023-09-21 DIAGNOSIS — F39 Unspecified mood [affective] disorder: Secondary | ICD-10-CM | POA: Diagnosis not present

## 2023-09-21 DIAGNOSIS — Z8679 Personal history of other diseases of the circulatory system: Secondary | ICD-10-CM

## 2023-09-21 DIAGNOSIS — R7309 Other abnormal glucose: Secondary | ICD-10-CM

## 2023-09-21 DIAGNOSIS — Z1159 Encounter for screening for other viral diseases: Secondary | ICD-10-CM

## 2023-09-21 DIAGNOSIS — I214 Non-ST elevation (NSTEMI) myocardial infarction: Secondary | ICD-10-CM

## 2023-09-21 DIAGNOSIS — Z720 Tobacco use: Secondary | ICD-10-CM

## 2023-09-21 DIAGNOSIS — G4730 Sleep apnea, unspecified: Secondary | ICD-10-CM

## 2023-09-21 DIAGNOSIS — E538 Deficiency of other specified B group vitamins: Secondary | ICD-10-CM

## 2023-09-21 DIAGNOSIS — E559 Vitamin D deficiency, unspecified: Secondary | ICD-10-CM

## 2023-09-21 DIAGNOSIS — F1220 Cannabis dependence, uncomplicated: Secondary | ICD-10-CM | POA: Diagnosis not present

## 2023-09-21 DIAGNOSIS — Z13220 Encounter for screening for lipoid disorders: Secondary | ICD-10-CM

## 2023-09-21 MED ORDER — CITALOPRAM HYDROBROMIDE 10 MG PO TABS: 10.0000 mg | ORAL_TABLET | Freq: Every day | ORAL | 0 refills | Status: DC

## 2023-09-21 NOTE — Patient Instructions (Addendum)
 It was a pleasure meeting you today. Thank you for allowing me to take part in your health care.  Our goals for today as we discussed include:  Schedule lab appointment. Fast for 10 hours before appointment  Start Celexa 10 mg daily Please notify MD of any side effects in 1 week. Follow up in 2 weeks  Referral sent to Pulmonology for sleep studies  Tetanus received today.  Booster every 10 years  [facility]  [address]  [place] [address] [phone]    [email or web address] [address], [place] [phone]   For Mental Health Concerns  [facility] [phone] Address: [address] [place], [place] [address] Hours: Open 24/7, No appointment required.     This is a list of the screening recommended for you and due dates:  Health Maintenance  Topic Date Due   Pneumococcal Vaccination (1 of 2 - PCV) Never done   Hepatitis C Screening  Never done   DTaP/Tdap/Td vaccine (6 - Tdap) 11/25/2009   COVID-19 Vaccine (2 - 2024-25 season) 04/05/2023   Flu Shot  11/02/2023*   HIV Screening  Completed   HPV Vaccine  Aged Out  *Topic was postponed. The date shown is not the original due date.      If you have any questions or concerns, please do not hesitate to call the office at [phone].  I look forward to our next visit and until then take care and stay safe.  Regards,   Dana Allan, MD   [facility]

## 2023-09-21 NOTE — Progress Notes (Signed)
 SUBJECTIVE:   Chief Complaint  Patient presents with   Establish Care   HPI Presents to clinic to establish care  Discussed the use of AI scribe software for clinical note transcription with the patient, who gave verbal consent to proceed.  History of Present Illness James Mcconnell is a 38 year old male who presents to establish care and discuss changes in his health.  He is concerned about changes in his body and wants to ensure his health is in good standing. He does not have any acute issues requiring immediate attention but seeks to be proactive about his health.  In 2018, he was evaluated for a possible myocardial infarction, which was ruled out after a catheterization showed no significant findings. He has not had any follow-up with cardiology and is not on any cardiac medications.  He experiences occasional gastrointestinal symptoms, including nausea and heartburn, which he attributes to his diet, particularly spicy foods. Indigestion can be severe at times. No chest pain, shortness of breath, hypertension, diabetes, thyroid issues, asthma, COPD, or significant respiratory issues.  He smokes half a pack of cigarettes daily, having resumed smoking a few months ago after a two-and-a-half-year hiatus. He started smoking at age 75 and initially smoked about five cigarettes a day. He uses marijuana daily to manage back pain and other pains, stating it helps with stomach discomfort and anxiety-like symptoms. He occasionally consumes alcohol, about two to three beers twice a month.  He has a family history of cancer, including prostate cancer in his maternal uncle, brain tumors in his maternal grandfather, and breast cancer in his maternal grandmother. He is concerned about cancer risk due to his family history.  He reports being anemic with low iron levels and has not had any recent lab work. He works as a Therapist, sports at [employer], Social worker, and does not use marijuana while  working.   He reports suicide thoughts.  Last was couple of days ago. Typically happens when he gets frustrated.  Denies any plan or access to firearms.  Endorses positive factors of spouse, children and grandchild.  Uses marijuana and self isolates to calm down. Open to SSRI today.   PERTINENT PMH / PSH: As above  OBJECTIVE:  BP 124/78   Pulse 76   Temp 98.4 F (36.9 C)   Resp 18   Ht 5\' 7"  (1.702 m)   Wt 211 lb (95.7 kg)   SpO2 97%   BMI 33.05 kg/m    Physical Exam Vitals reviewed.  Constitutional:      Appearance: He is obese.  HENT:     Head: Normocephalic.     Right Ear: Tympanic membrane, ear canal and external ear normal.     Left Ear: Tympanic membrane, ear canal and external ear normal.     Nose: Nose normal.     Mouth/Throat:     Mouth: Mucous membranes are moist.  Eyes:     Conjunctiva/sclera: Conjunctivae normal.     Pupils: Pupils are equal, round, and reactive to light.  Neck:     Vascular: No carotid bruit.  Cardiovascular:     Rate and Rhythm: Normal rate and regular rhythm.     Pulses: Normal pulses.     Heart sounds: Normal heart sounds.  Pulmonary:     Effort: Pulmonary effort is normal.     Breath sounds: Normal breath sounds.  Abdominal:     General: Abdomen is flat. Bowel sounds are normal.     Palpations:  Abdomen is soft.  Musculoskeletal:        General: Normal range of motion.     Cervical back: Normal range of motion and neck supple.     Right lower leg: No edema.     Left lower leg: No edema.  Lymphadenopathy:     Cervical: No cervical adenopathy.  Neurological:     Mental Status: He is alert.  Psychiatric:        Mood and Affect: Mood normal. Affect is flat.        Behavior: Behavior normal.        Thought Content: Thought content normal.        Judgment: Judgment normal.           09/21/2023    3:10 PM  Depression screen PHQ 2/9  Decreased Interest 1  Down, Depressed, Hopeless 1  PHQ - 2 Score 2  Altered sleeping 2   Tired, decreased energy 2  Change in appetite 0  Feeling bad or failure about yourself  0  Trouble concentrating 0  Moving slowly or fidgety/restless 0  Suicidal thoughts 1  PHQ-9 Score 7  Difficult doing work/chores Somewhat difficult      09/21/2023    3:11 PM  GAD 7 : Generalized Anxiety Score  Nervous, Anxious, on Edge 0  Worry too much - different things 0  Trouble relaxing 0  Restless 0  Easily annoyed or irritable 0  Afraid - awful might happen 0  Anxiety Difficulty Somewhat difficult    ASSESSMENT/PLAN:  Mood disorder (HCC) Assessment & Plan: Positive PHQ9.  No thought of SI today.  No current active plan.  Positive factors include children and grandchildren. No access to firearms. MDQ screening negative -Start Celexa 10 mg daily -Mental health resources provided -Follow up in 2 weeks  Orders: -     Citalopram Hydrobromide; Take 1 tablet (10 mg total) by mouth daily.  Dispense: 30 tablet; Refill: 0  Tetrahydrocannabinol (THC) use disorder, moderate, dependence (HCC) Assessment & Plan: Daily use reported, primarily for pain management and mood regulation. -Discuss potential health risks associated with daily cannabis use   Encounter for immunization -     Tdap vaccine greater than or equal to 7yo IM  NSTEMI (non-ST elevated myocardial infarction) [facility]) Assessment & Plan: Chest pain 2018. Had elevated troponins. PCI negative.  NSTEMI ruled out. Thought to be secondary to inflammatory vs mild pericarditis.  Seen by Cardiology 04/2017 and recommended Myoview with f/u in 6 months.  Patient lost to follow up.  Reports no further chest pain. -Consider referral to Cardiology if symptoms recur   Fatigue, unspecified type -     TSH; Future  Vitamin D deficiency Assessment & Plan: Check Vitamin D level  Orders: -     VITAMIN D 25 Hydroxy (Vit-D Deficiency, Fractures); Future  Vitamin B 12 deficiency Assessment & Plan: Check Vitamin B 12 level  Orders: -      Vitamin B12; Future  Need for hepatitis C screening test -     Hepatitis C antibody; Future  Encounter for screening for HIV -     HIV Antibody (routine testing w rflx); Future  Lipid screening -     Lipid panel; Future  Abnormal glucose -     Hemoglobin A1c; Future  History of hypertension Assessment & Plan: Has never been on antihypertensive medication. Reports having elevated BP when having pain years ago  Orders: -     Comprehensive metabolic panel; Future -  CBC with Differential/Platelet; Future  Sleep apnea in adult Assessment & Plan: Reports snoring and occasional cessation of breathing during sleep, reported by spouse -Epworth Sleepiness Scale score 11 -Stop Bang score 5 -Referral to pulmonology for sleep study   Orders: -     Ambulatory referral to Pulmonology  Tobacco use Assessment & Plan: Reports smoking half a pack of cigarettes daily for the past year, after a 2-year cessation period. -Encourage smoking cessation for overall health improvement.     PDMP reviewed  Return in about 2 weeks (around 10/05/2023) for PCP, Mood .  Dana Allan, MD

## 2023-09-27 ENCOUNTER — Encounter: Payer: Self-pay | Admitting: Family Medicine

## 2023-09-27 DIAGNOSIS — Z13220 Encounter for screening for lipoid disorders: Secondary | ICD-10-CM | POA: Insufficient documentation

## 2023-09-27 DIAGNOSIS — F39 Unspecified mood [affective] disorder: Secondary | ICD-10-CM | POA: Insufficient documentation

## 2023-09-27 DIAGNOSIS — F1220 Cannabis dependence, uncomplicated: Secondary | ICD-10-CM | POA: Insufficient documentation

## 2023-09-27 DIAGNOSIS — Z23 Encounter for immunization: Secondary | ICD-10-CM | POA: Insufficient documentation

## 2023-09-27 DIAGNOSIS — E538 Deficiency of other specified B group vitamins: Secondary | ICD-10-CM | POA: Insufficient documentation

## 2023-09-27 DIAGNOSIS — Z8679 Personal history of other diseases of the circulatory system: Secondary | ICD-10-CM | POA: Insufficient documentation

## 2023-09-27 DIAGNOSIS — E559 Vitamin D deficiency, unspecified: Secondary | ICD-10-CM | POA: Insufficient documentation

## 2023-09-27 DIAGNOSIS — Z720 Tobacco use: Secondary | ICD-10-CM | POA: Insufficient documentation

## 2023-09-27 DIAGNOSIS — G4730 Sleep apnea, unspecified: Secondary | ICD-10-CM | POA: Insufficient documentation

## 2023-09-27 DIAGNOSIS — Z1159 Encounter for screening for other viral diseases: Secondary | ICD-10-CM | POA: Insufficient documentation

## 2023-09-27 DIAGNOSIS — Z114 Encounter for screening for human immunodeficiency virus [HIV]: Secondary | ICD-10-CM | POA: Insufficient documentation

## 2023-09-27 DIAGNOSIS — R5383 Other fatigue: Secondary | ICD-10-CM | POA: Insufficient documentation

## 2023-09-27 DIAGNOSIS — R7309 Other abnormal glucose: Secondary | ICD-10-CM | POA: Insufficient documentation

## 2023-09-27 NOTE — Assessment & Plan Note (Addendum)
 Reports snoring and occasional cessation of breathing during sleep, reported by spouse -Epworth Sleepiness Scale score 11 -Stop Bang score 5 -Referral to pulmonology for sleep study

## 2023-09-27 NOTE — Assessment & Plan Note (Signed)
 Chest pain 2018. Had elevated troponins. PCI negative.  NSTEMI ruled out. Thought to be secondary to inflammatory vs mild pericarditis.  Seen by Cardiology 04/2017 and recommended Myoview with f/u in 6 months.  Patient lost to follow up.  Reports no further chest pain. -Consider referral to Cardiology if symptoms recur

## 2023-09-27 NOTE — Assessment & Plan Note (Addendum)
 Has never been on antihypertensive medication. Reports having elevated BP when having pain years ago

## 2023-09-27 NOTE — Assessment & Plan Note (Signed)
 Check Vitamin B 12 level

## 2023-09-27 NOTE — Assessment & Plan Note (Signed)
 Reports smoking half a pack of cigarettes daily for the past year, after a 2-year cessation period. -Encourage smoking cessation for overall health improvement.

## 2023-09-27 NOTE — Assessment & Plan Note (Signed)
 Daily use reported, primarily for pain management and mood regulation. -Discuss potential health risks associated with daily cannabis use

## 2023-09-27 NOTE — Assessment & Plan Note (Deleted)
 Chest pain 2018. Had elevated troponins. PCI negative.  NSTEMI ruled out. Thought to be secondary to inflammatory vs mild pericarditis.  Seen by Cardiology 04/2017 and recommended Myoview with f/u in 6 months.  Patient lost to follow up.  Reports no further chest pain. -Consider referral to Cardiology if symptoms recur

## 2023-09-27 NOTE — Assessment & Plan Note (Signed)
 Check Vitamin D level

## 2023-09-27 NOTE — Assessment & Plan Note (Signed)
 Positive PHQ9.  No thought of SI today.  No current active plan.  Positive factors include children and grandchildren. No access to firearms. MDQ screening negative -Start Celexa 10 mg daily -Mental health resources provided -Follow up in 2 weeks

## 2023-10-05 ENCOUNTER — Ambulatory Visit: Payer: BC Managed Care – PPO | Admitting: Family Medicine

## 2023-10-18 ENCOUNTER — Other Ambulatory Visit: Payer: Self-pay | Admitting: Family Medicine

## 2023-10-18 DIAGNOSIS — F39 Unspecified mood [affective] disorder: Secondary | ICD-10-CM

## 2023-10-22 ENCOUNTER — Telehealth: Payer: Self-pay

## 2023-10-22 DIAGNOSIS — F39 Unspecified mood [affective] disorder: Secondary | ICD-10-CM

## 2023-10-22 NOTE — Telephone Encounter (Signed)
 Called pt but vm was full pt need to scheduled an appointment to get medication refilled.

## 2024-04-18 ENCOUNTER — Ambulatory Visit
Admission: EM | Admit: 2024-04-18 | Discharge: 2024-04-18 | Disposition: A | Discharge status: Home / Self Care | Payer: Self-pay | Attending: Family Medicine | Admitting: Family Medicine

## 2024-04-18 DIAGNOSIS — L03011 Cellulitis of right finger: Secondary | ICD-10-CM

## 2024-04-18 MED ORDER — CEPHALEXIN 500 MG PO CAPS: 500.0000 mg | ORAL_CAPSULE | Freq: Three times a day (TID) | ORAL | 0 refills | Status: DC

## 2024-04-18 MED ORDER — MUPIROCIN 2 % EX OINT: 1.0000 | TOPICAL_OINTMENT | Freq: Two times a day (BID) | CUTANEOUS | 0 refills | Status: DC

## 2024-04-18 NOTE — Discharge Instructions (Addendum)
 Stop by the pharmacy to pick up your prescriptions.  Follow up with your primary care provider or return to the urgent care, if not improving.

## 2024-04-18 NOTE — ED Provider Notes (Signed)
 [facility]    CSN: [id number] Arrival date & time: 04/18/24  1138      History   Chief Complaint Chief Complaint  Patient presents with   Finger Injury    HPI  HPI Alpheus Hillyard is a 38 y.o. male.   Sergey presents for right middle pain and swelling that started 4 days ago.  Pain described as throbbing. Soaked his hand in epsom salt which helped somewhat. The finger started bleeding today.  He bites his nails. Noticed some yellow pus come out previously.  Has history of nail infections.      Past Medical History:  Diagnosis Date   Chest pain 04/18/2017    Patient Active Problem List   Diagnosis Date Noted   Encounter for immunization 09/27/2023   Fatigue 09/27/2023   Vitamin D deficiency 09/27/2023   Vitamin B 12 deficiency 09/27/2023   Need for hepatitis C screening test 09/27/2023   Encounter for screening for HIV 09/27/2023   Lipid screening 09/27/2023   Abnormal glucose 09/27/2023   History of hypertension 09/27/2023   Mood disorder (HCC) 09/27/2023   Sleep apnea in adult 09/27/2023   Tobacco use 09/27/2023   Tetrahydrocannabinol (THC) use disorder, moderate, dependence (HCC) 09/27/2023   NSTEMI (non-ST elevated myocardial infarction) (HCC) 04/18/2017   Abnormal EKG 04/18/2017   HTN (hypertension) 04/18/2017    Past Surgical History:  Procedure Laterality Date   LEFT HEART CATH AND CORONARY ANGIOGRAPHY N/A 04/20/2017   Procedure: LEFT HEART CATH AND CORONARY ANGIOGRAPHY and pci;  Surgeon: Florencio Cara BIRCH, MD;  Location: [facility] INVASIVE CV LAB;  Service: Cardiovascular;  Laterality: N/A;   NO PAST SURGERIES         Home Medications    Prior to Admission medications   Medication Sig Start Date End Date Taking? Authorizing Provider  cephALEXin  (KEFLEX ) 500 MG capsule Take 1 capsule (500 mg total) by mouth 3 (three) times daily. 04/18/24  Yes Chauncey Sciulli, DO  mupirocin  ointment (BACTROBAN ) 2 % Apply 1 Application topically 2 (two) times  daily. 04/18/24  Yes Patte Winkel, DO  citalopram  (CELEXA ) 10 MG tablet Take 1 tablet (10 mg total) by mouth daily. 09/21/23   Hope Merle, MD    Family History History reviewed. No pertinent family history.  Social History Social History   Tobacco Use   Smoking status: Some Days    Current packs/day: 0.25    Average packs/day: 0.3 packs/day for 5.0 years (1.3 ttl pk-yrs)    Types: Cigarettes   Smokeless tobacco: Never  Vaping Use   Vaping status: Never Used  Substance Use Topics   Alcohol use: Not Currently    Comment: occasional   Drug use: No     Allergies   Aspirin  and Other   Review of Systems Review of Systems: :negative unless otherwise stated in HPI.      Physical Exam Triage Vital Signs ED Triage Vitals [04/18/24 1208]  Encounter Vitals Group     BP (!) 138/92     Girls Systolic BP Percentile      Girls Diastolic BP Percentile      Boys Systolic BP Percentile      Boys Diastolic BP Percentile      Pulse Rate (!) 56     Resp 18     Temp 98.2 F (36.8 C)     Temp Source Oral     SpO2 100 %     Weight 215 lb (97.5 kg)     Height  Head Circumference      Peak Flow      Pain Score 9     Pain Loc      Pain Education      Exclude from Growth Chart    No data found.  Updated Vital Signs BP (!) 138/92 (BP Location: Left Arm)   Pulse (!) 56   Temp 98.2 F (36.8 C) (Oral)   Resp 18   Wt 97.5 kg   SpO2 100%   BMI 33.67 kg/m   Visual Acuity Right Eye Distance:   Left Eye Distance:   Bilateral Distance:    Right Eye Near:   Left Eye Near:    Bilateral Near:     Physical Exam GEN: well appearing male in no acute distress  CVS: well perfused  RESP: speaking in full sentences without pause, no respiratory distress  MSK: normal ROM of right middle finger.  Erythema and edema of distal tip  SKIN: bloody drainage for the lateral edge of his nail bed.     UC Treatments / Results  Labs (all labs ordered are listed, but only abnormal  results are displayed) Labs Reviewed - No data to display  EKG   Radiology No results found.   Procedures Procedures (including critical care time)  Medications Ordered in UC Medications - No data to display  Initial Impression / Assessment and Plan / UC Course  I have reviewed the triage vital signs and the nursing notes.  Pertinent labs & imaging results that were available during my care of the patient were reviewed by me and considered in my medical decision making (see chart for details).      Pt is a 38 y.o.  male with 4 days of left middle finger pain with swelling and discharge.   On exam, pt has tenderness at distal tip and lateral nail concerning for paronychia.  He has no purulent area for drainage but has bloody at the lateral edge. No I&D recommended at this time. Treat empirically with antibiotics as below. Advised to soak 3 times a day with massage of distal temp to promote self drainage.  Patient to gradually return to normal activities, as tolerated and continue ordinary activities within the limits permitted by pain.  Tylenol  PRN.    Return and ED precautions given. Understanding voiced. Discussed MDM, treatment plan and plan for follow-up with patient who agrees with plan.   Final Clinical Impressions(s) / UC Diagnoses   Final diagnoses:  Paronychia, finger, right     Discharge Instructions      Stop by the pharmacy to pick up your prescriptions.  Follow up with your primary care provider or return to the urgent care, if not improving.       ED Prescriptions     Medication Sig Dispense Auth. Provider   cephALEXin  (KEFLEX ) 500 MG capsule Take 1 capsule (500 mg total) by mouth 3 (three) times daily. 21 capsule Rosamary Boudreau, DO   mupirocin  ointment (BACTROBAN ) 2 % Apply 1 Application topically 2 (two) times daily. 22 g Demarques Pilz, DO      PDMP not reviewed this encounter.   Kannan Proia, DO 04/18/24 1920

## 2024-04-18 NOTE — ED Triage Notes (Signed)
 Patient states that sx have been going on for 4 days  Right middle finger swollen, started bleeding today. Patient bites nails.

## 2024-08-19 ENCOUNTER — Encounter: Payer: Self-pay | Admitting: Family Medicine

## 2024-08-19 ENCOUNTER — Other Ambulatory Visit: Payer: Self-pay

## 2024-08-19 ENCOUNTER — Ambulatory Visit (INDEPENDENT_AMBULATORY_CARE_PROVIDER_SITE_OTHER): Payer: Self-pay | Admitting: Family Medicine

## 2024-08-19 VITALS — BP 122/82 | HR 75 | Temp 98.2°F | Ht 67.0 in | Wt 203.0 lb

## 2024-08-19 DIAGNOSIS — Z13220 Encounter for screening for lipoid disorders: Secondary | ICD-10-CM

## 2024-08-19 DIAGNOSIS — Z1159 Encounter for screening for other viral diseases: Secondary | ICD-10-CM

## 2024-08-19 DIAGNOSIS — Z136 Encounter for screening for cardiovascular disorders: Secondary | ICD-10-CM

## 2024-08-19 DIAGNOSIS — E559 Vitamin D deficiency, unspecified: Secondary | ICD-10-CM

## 2024-08-19 DIAGNOSIS — F39 Unspecified mood [affective] disorder: Secondary | ICD-10-CM

## 2024-08-19 DIAGNOSIS — R030 Elevated blood-pressure reading, without diagnosis of hypertension: Secondary | ICD-10-CM | POA: Diagnosis not present

## 2024-08-19 DIAGNOSIS — Z8679 Personal history of other diseases of the circulatory system: Secondary | ICD-10-CM

## 2024-08-19 DIAGNOSIS — E669 Obesity, unspecified: Secondary | ICD-10-CM

## 2024-08-19 DIAGNOSIS — Z720 Tobacco use: Secondary | ICD-10-CM | POA: Diagnosis not present

## 2024-08-19 DIAGNOSIS — Z114 Encounter for screening for human immunodeficiency virus [HIV]: Secondary | ICD-10-CM

## 2024-08-19 DIAGNOSIS — E538 Deficiency of other specified B group vitamins: Secondary | ICD-10-CM

## 2024-08-19 DIAGNOSIS — R7309 Other abnormal glucose: Secondary | ICD-10-CM

## 2024-08-19 DIAGNOSIS — Z8719 Personal history of other diseases of the digestive system: Secondary | ICD-10-CM

## 2024-08-19 DIAGNOSIS — Z130 Encounter for screening for diseases of the blood and blood-forming organs and certain disorders involving the immune mechanism: Secondary | ICD-10-CM | POA: Diagnosis not present

## 2024-08-19 DIAGNOSIS — Z113 Encounter for screening for infections with a predominantly sexual mode of transmission: Secondary | ICD-10-CM

## 2024-08-19 DIAGNOSIS — Z131 Encounter for screening for diabetes mellitus: Secondary | ICD-10-CM | POA: Diagnosis not present

## 2024-08-19 DIAGNOSIS — R5383 Other fatigue: Secondary | ICD-10-CM

## 2024-08-19 MED ORDER — CITALOPRAM HYDROBROMIDE 10 MG PO TABS: 10.0000 mg | ORAL_TABLET | Freq: Every day | ORAL | 0 refills | Status: DC

## 2024-08-19 MED ORDER — HYDROCORTISONE (PERIANAL) 2.5 % EX CREA: 1.0000 | TOPICAL_CREAM | Freq: Two times a day (BID) | CUTANEOUS | 2 refills | Status: AC

## 2024-08-19 NOTE — Progress Notes (Signed)
 "  New Patient Office Visit  Patient ID: James Mcconnell, Male   DOB: 1986-02-02 38 y.o. MRN: [id number]  Chief Complaint  Patient presents with   Establish Care   Subjective:     James Mcconnell presents to establish care  HPI  Discussed the use of AI scribe software for clinical note transcription with the patient, who gave verbal consent to proceed.  History of Present Illness James Mcconnell is a 39 year old male who presents for establishing care and management of his medications.  He has a history of anger issues and overthinking, which have significantly impacted his personal and professional life. He describes having a strong anger attitude and difficulty controlling impulses, leading to job losses and marital stress. He attended anger management classes as a child. He previously used Celexa  for about three months last year, which helped manage his symptoms, improve focus, and reduce aggression. He has not been on the medication since due to work constraints.  He reports significant weight loss from 220 pounds to 200 pounds over the past month and a half, attributed to decreased appetite and overthinking. He mentions eating more fruits and vegetables and less meat, and has started walking for exercise. No nausea or vomiting, but he notes a lack of appetite for heavier foods.  He reports pain in the rectal area and occasional bleeding on toilet paper. He has been using over-the-counter treatments but the symptoms still recur. No constipation and regular bowel movements are reported.  He has a history of chest pain associated with energy drink consumption in the past, but no recent episodes. He smokes about a pack every three days. No family history of mental health disorders but his uncle passed away from prostate cancer in 2020.   Outpatient Encounter Medications as of 08/19/2024  Medication Sig   hydrocortisone  (ANUSOL -HC) 2.5 % rectal cream Place 1 Application rectally 2 (two) times  daily.   citalopram  (CELEXA ) 10 MG tablet Take 1 tablet (10 mg total) by mouth daily.   [DISCONTINUED] cephALEXin  (KEFLEX ) 500 MG capsule Take 1 capsule (500 mg total) by mouth 3 (three) times daily. (Patient not taking: Reported on 08/19/2024)   [DISCONTINUED] citalopram  (CELEXA ) 10 MG tablet Take 1 tablet (10 mg total) by mouth daily. (Patient not taking: Reported on 08/19/2024)   [DISCONTINUED] mupirocin  ointment (BACTROBAN ) 2 % Apply 1 Application topically 2 (two) times daily. (Patient not taking: Reported on 08/19/2024)   No facility-administered encounter medications on file as of 08/19/2024.    Past Medical History:  Diagnosis Date   Chest pain 04/18/2017    Past Surgical History:  Procedure Laterality Date   LEFT HEART CATH AND CORONARY ANGIOGRAPHY N/A 04/20/2017   Procedure: LEFT HEART CATH AND CORONARY ANGIOGRAPHY and pci;  Surgeon: Florencio Cara BIRCH, MD;  Location: [facility] INV[facility]IVE CV [facility];  Service: Cardiovascular;  Laterality: N/A;   NO PAST SURGERIES      History reviewed. No pertinent family history.  Social History   Socioeconomic History   Marital status: Married    Spouse name: Not on file   Number of children: Not on file   Years of education: Not on file   Highest education level: GED or equivalent  Occupational History   Not on file  Tobacco Use   Smoking status: Some Days    Current packs/day: 0.25    Average packs/day: 0.3 packs/day for 5.0 years (1.3 ttl pk-yrs)    Types: E-cigarettes, Cigarettes   Smokeless tobacco: Never  Vaping Use  Vaping status: Never Used  Substance and Sexual Activity   Alcohol use: Not Currently    Comment: occasional   Drug use: No   Sexual activity: Yes  Other Topics Concern   Not on file  Social History Narrative   Not on file   Social Drivers of Health   Tobacco Use: High Risk (08/19/2024)   Patient History    Smoking Tobacco Use: Some Days    Smokeless Tobacco Use: Never    Passive Exposure: Not on file   Financial Resource Strain: Low Risk (08/18/2024)   Overall Financial Resource Strain (CARDIA)    Difficulty of Paying Living Expenses: Not very hard  Food Insecurity: No Food Insecurity (08/18/2024)   Epic    Worried About Programme Researcher, Broadcasting/film/video in the Last Year: Never true    Ran Out of Food in the Last Year: Never true  Transportation Needs: Unmet Transportation Needs (08/18/2024)   Epic    Lack of Transportation (Medical): No    Lack of Transportation (Non-Medical): Yes  Physical Activity: Sufficiently Active (08/18/2024)   Exercise Vital Sign    Days of Exercise per Week: 5 days    Minutes of Exercise per Session: 30 min  Stress: Stress Concern Present (08/18/2024)   [employer] of Occupational Health - Occupational Stress Questionnaire    Feeling of Stress: Very much  Social Connections: Moderately Integrated (08/18/2024)   Social Connection and Isolation Panel    Frequency of Communication with Friends and Family: Twice a week    Frequency of Social Gatherings with Friends and Family: Twice a week    Attends Religious Services: 1 to 4 times per year    Active Member of [employer] or Organizations: No    Attends Banker Meetings: Not on file    Marital Status: Married  Catering Manager Violence: Not At Risk (08/19/2024)   Epic    Fear of Current or Ex-Partner: No    Emotionally Abused: No    Physically Abused: No    Sexually Abused: No  Depression (PHQ2-9): Medium Risk (09/21/2023)   Depression (PHQ2-9)    PHQ-2 Score: 7  Alcohol Screen: Low Risk (08/18/2024)   Alcohol Screen    Last Alcohol Screening Score (AUDIT): 2  Housing: Low Risk (08/18/2024)   Epic    Unable to Pay for Housing in the Last Year: No    Number of Times Moved in the Last Year: 0    Homeless in the Last Year: No  Utilities: Not At Risk (08/19/2024)   Epic    Threatened with loss of utilities: No  Health Literacy: Not on file    Review of Systems  All other systems reviewed and are  negative.     Objective:    BP 122/82   Pulse 75   Temp 98.2 F (36.8 C) (Oral)   Ht 5' 7 (1.702 m)   Wt 203 lb (92.1 kg)   SpO2 99%   BMI 31.79 kg/m   Physical Exam Vitals and nursing note reviewed.  Constitutional:      Appearance: Normal appearance.  HENT:     Head: Normocephalic.     Right Ear: External ear normal.     Left Ear: External ear normal.  Eyes:     Conjunctiva/sclera: Conjunctivae normal.  Cardiovascular:     Rate and Rhythm: Normal rate.  Pulmonary:     Effort: Pulmonary effort is normal. No respiratory distress.  Abdominal:     Palpations: Abdomen is  soft.  Musculoskeletal:        General: Normal range of motion.  Skin:    General: Skin is warm.  Neurological:     Mental Status: He is alert and oriented to person, place, and time.  Psychiatric:        Mood and Affect: Mood normal.    Physical Exam           Assessment & Plan:   Problem List Items Addressed This Visit       Other   Mood disorder   Relevant Medications   citalopram  (CELEXA ) 10 MG tablet   Tobacco use   Other Visit Diagnoses       History of rectal bleeding    -  Primary   Relevant Medications   hydrocortisone  (ANUSOL -HC) 2.5 % rectal cream   Other Relevant Orders   Ambulatory referral to Colorectal Surgery   CBC with Differential     Encounter for lipid screening for cardiovascular disease       Relevant Orders   Lipid panel     Diabetes mellitus screening       Relevant Orders   Hemoglobin A1c     Encounter for hepatitis C screening test for low risk patient       Relevant Orders   Hepatitis C antibody     Screening for iron deficiency anemia         Elevated blood pressure reading       Relevant Orders   Comprehensive Metabolic Panel (CMET)     Obesity (BMI 30-39.9)       Relevant Orders   TSH + free T4     Screening for STD (sexually transmitted disease)       Relevant Orders   HIV Antibody (routine testing w rflx)       Assessment and  Plan Assessment & Plan Mood disorder Symptoms of impulsive anger, overthinking, and anxiety returned after Celexa  discontinuation. - Restarted Celexa  as previously prescribed. - Instructed to monitor for improvement or side effects and report if no improvement or side effects occur.  Hemorrhoids with history of rectal bleeding Intermittent hemorrhoids with rectal bleeding, internal rectal pain, and irritation. No recent bleeding for two months. Referral to colorectal surgeon due to age and symptoms. - Prescribed rectal cream for hemorrhoid management. - Referred to colorectal surgeon for further evaluation. - Advised to increase water intake and dietary fiber. - Recommended over-the-counter stool softeners if needed. - Instructed to return if rectal bleeding recurs.  Tobacco use Current tobacco use with smoking approximately a pack every three days. No recent chest pain or hypertension.    No follow-ups on file.   Neil Errickson K Deztiny Sarra, MD [facility] at [facility]     "

## 2024-08-20 LAB — CBC WITH DIFFERENTIAL/PLATELET
Basophils Absolute: 0 x10E3/uL (ref 0.0–0.2)
Basos: 1 %
EOS (ABSOLUTE): 0 x10E3/uL (ref 0.0–0.4)
Eos: 1 %
Hematocrit: 46.8 % (ref 37.5–51.0)
Hemoglobin: 15.2 g/dL (ref 13.0–17.7)
Immature Grans (Abs): 0 x10E3/uL (ref 0.0–0.1)
Immature Granulocytes: 0 %
Lymphocytes Absolute: 1.7 x10E3/uL (ref 0.7–3.1)
Lymphs: 30 %
MCH: 28 pg (ref 26.6–33.0)
MCHC: 32.5 g/dL (ref 31.5–35.7)
MCV: 86 fL (ref 79–97)
Monocytes Absolute: 0.3 x10E3/uL (ref 0.1–0.9)
Monocytes: 5 %
Neutrophils Absolute: 3.5 x10E3/uL (ref 1.4–7.0)
Neutrophils: 63 %
Platelets: 305 x10E3/uL (ref 150–450)
RBC: 5.42 x10E6/uL (ref 4.14–5.80)
RDW: 12.7 % (ref 11.6–15.4)
WBC: 5.6 x10E3/uL (ref 3.4–10.8)

## 2024-08-20 LAB — COMPREHENSIVE METABOLIC PANEL WITH GFR
ALT: 41 IU/L (ref 0–44)
AST: 24 IU/L (ref 0–40)
Albumin: 5 g/dL (ref 4.1–5.1)
Alkaline Phosphatase: 91 IU/L (ref 47–123)
BUN/Creatinine Ratio: 9 (ref 9–20)
BUN: 9 mg/dL (ref 6–20)
Bilirubin Total: 0.8 mg/dL (ref 0.0–1.2)
CO2: 25 mmol/L (ref 20–29)
Calcium: 10.1 mg/dL (ref 8.7–10.2)
Chloride: 100 mmol/L (ref 96–106)
Creatinine, Ser: 1 mg/dL (ref 0.76–1.27)
Globulin, Total: 3.1 g/dL (ref 1.5–4.5)
Glucose: 104 mg/dL — ABNORMAL HIGH (ref 70–99)
Potassium: 4.4 mmol/L (ref 3.5–5.2)
Sodium: 139 mmol/L (ref 134–144)
Total Protein: 8.1 g/dL (ref 6.0–8.5)
eGFR: 99 mL/min/1.73

## 2024-08-20 LAB — HEMOGLOBIN A1C
Est. average glucose Bld gHb Est-mCnc: 120 mg/dL
Hgb A1c MFr Bld: 5.8 % — ABNORMAL HIGH (ref 4.8–5.6)

## 2024-08-20 LAB — HIV ANTIBODY (ROUTINE TESTING W REFLEX): HIV Screen 4th Generation wRfx: NONREACTIVE

## 2024-08-20 LAB — LIPID PANEL
Chol/HDL Ratio: 3.4 ratio (ref 0.0–5.0)
Cholesterol, Total: 158 mg/dL (ref 100–199)
HDL: 47 mg/dL
LDL Chol Calc (NIH): 92 mg/dL (ref 0–99)
Triglycerides: 102 mg/dL (ref 0–149)
VLDL Cholesterol Cal: 19 mg/dL (ref 5–40)

## 2024-08-20 LAB — TSH+FREE T4
Free T4: 1.55 ng/dL (ref 0.82–1.77)
TSH: 0.848 u[IU]/mL (ref 0.450–4.500)

## 2024-08-20 LAB — HEPATITIS C ANTIBODY: Hep C Virus Ab: NONREACTIVE

## 2024-08-22 ENCOUNTER — Ambulatory Visit: Payer: Self-pay | Admitting: Family Medicine

## 2024-09-01 ENCOUNTER — Encounter: Payer: Self-pay | Admitting: General Surgery

## 2024-09-01 ENCOUNTER — Ambulatory Visit: Payer: Self-pay | Admitting: General Surgery

## 2024-09-01 VITALS — BP 150/91 | HR 79 | Ht 67.0 in | Wt 198.0 lb

## 2024-09-01 DIAGNOSIS — K625 Hemorrhage of anus and rectum: Secondary | ICD-10-CM

## 2024-09-01 NOTE — Patient Instructions (Signed)
 We referred you to [facility] for a Colonoscopy, they will call you for an appointment. In the meantime if you have any questions or concerns please dont hesitate to call our office   Rectal Bleeding  Rectal bleeding is when blood comes out of the opening of the butt (anus). You may see bright red blood in your underwear or in the toilet after you poop (have a bowel movement). You may also have blood mixed with your poop (stool), or dark red or black poop. Rectal bleeding is often a sign that something is wrong. It can be caused by many things. It needs to be checked by a doctor. Follow these instructions at home: Medicines Take over-the-counter and prescription medicines only as told by your doctor. Ask your doctor about changing or stopping your normal medicines. These include blood thinners. Managing constipation Your condition may cause trouble pooping (constipation). To prevent or treat this, or to help make your poop soft, you may need to: Drink enough fluid to keep your pee (urine) pale yellow. Take over-the-counter or prescription medicines. Eat foods that are high in fiber. These include beans, whole grains, and fresh fruits and vegetables. Limit foods that are high in fat and sugar. These include fried or sweet foods.  General instructions Try not to strain when you poop. Take a warm bath. This may help with pain. Watch for changes in your symptoms. Contact a doctor if: You have pain or swelling in your belly (abdomen). You have a fever. You feel weak or like you may vomit. You cannot poop. You have new or more bleeding. You have black or dark red poop. You vomit blood or something that looks like coffee grounds. Get help right away if: You faint. You have very bad pain in your butt. These symptoms may be an emergency. Get help right away. Call 911. Do not wait to see if the symptoms will go away. Do not drive yourself to the hospital. This information is not intended to  replace advice given to you by your health care provider. Make sure you discuss any questions you have with your health care provider. Document Revised: 03/11/2022 Document Reviewed: 03/11/2022 Elsevier Patient Education  2024 [employer].

## 2024-09-08 ENCOUNTER — Other Ambulatory Visit: Payer: Self-pay | Admitting: Family Medicine

## 2024-09-08 DIAGNOSIS — F39 Unspecified mood [affective] disorder: Secondary | ICD-10-CM

## 2024-09-09 NOTE — Telephone Encounter (Signed)
 Requested Prescriptions  Pending Prescriptions Disp Refills   citalopram  (CELEXA ) 10 MG tablet [Pharmacy Med Name: CITALOPRAM  10MG  TABLETS] 90 tablet 0    Sig: TAKE 1 TABLET(10 MG) BY MOUTH DAILY     Psychiatry:  Antidepressants - SSRI Passed - 09/09/2024  2:38 PM      Passed - Valid encounter within last 6 months    Recent Outpatient Visits           3 weeks ago History of rectal bleeding   [facility] at [facility], Vinay K, MD

## 2024-10-20 ENCOUNTER — Encounter: Admitting: Family Medicine
